# Patient Record
Sex: Female | Born: 1995 | Race: Black or African American | Hispanic: No | Marital: Single | Smoking: Former smoker
Health system: Southern US, Community
[De-identification: ages and names within clinical notes are randomized; demographics above are authoritative.]

## PROBLEM LIST (undated history)

## (undated) DIAGNOSIS — Z789 Other specified health status: Secondary | ICD-10-CM

## (undated) DIAGNOSIS — R002 Palpitations: Secondary | ICD-10-CM

## (undated) DIAGNOSIS — N920 Excessive and frequent menstruation with regular cycle: Secondary | ICD-10-CM

## (undated) DIAGNOSIS — Z8742 Personal history of other diseases of the female genital tract: Secondary | ICD-10-CM

## (undated) HISTORY — PX: NO PAST SURGERIES: SHX2092

---

## 2018-07-14 ENCOUNTER — Encounter (HOSPITAL_COMMUNITY): Payer: Self-pay | Admitting: *Deleted

## 2018-07-14 ENCOUNTER — Emergency Department (HOSPITAL_COMMUNITY)
Admission: EM | Admit: 2018-07-14 | Discharge: 2018-07-14 | Disposition: A | Discharge status: Home / Self Care | Payer: Self-pay | Attending: Emergency Medicine | Admitting: Emergency Medicine

## 2018-07-14 ENCOUNTER — Other Ambulatory Visit: Payer: Self-pay

## 2018-07-14 ENCOUNTER — Emergency Department (HOSPITAL_COMMUNITY): Payer: Self-pay

## 2018-07-14 DIAGNOSIS — R112 Nausea with vomiting, unspecified: Secondary | ICD-10-CM

## 2018-07-14 DIAGNOSIS — F1290 Cannabis use, unspecified, uncomplicated: Secondary | ICD-10-CM | POA: Insufficient documentation

## 2018-07-14 DIAGNOSIS — F17210 Nicotine dependence, cigarettes, uncomplicated: Secondary | ICD-10-CM | POA: Insufficient documentation

## 2018-07-14 DIAGNOSIS — R51 Headache: Secondary | ICD-10-CM | POA: Insufficient documentation

## 2018-07-14 DIAGNOSIS — R569 Unspecified convulsions: Secondary | ICD-10-CM

## 2018-07-14 LAB — CBC WITH DIFFERENTIAL/PLATELET
Abs Immature Granulocytes: 0 10*3/uL (ref 0.00–0.07)
Basophils Absolute: 0.1 10*3/uL (ref 0.0–0.1)
Basophils Relative: 1 %
Eosinophils Absolute: 0 10*3/uL (ref 0.0–0.5)
Eosinophils Relative: 0 %
HCT: 40.5 % (ref 36.0–46.0)
Hemoglobin: 13.3 g/dL (ref 12.0–15.0)
Lymphocytes Relative: 11 %
Lymphs Abs: 0.9 10*3/uL (ref 0.7–4.0)
MCH: 29.4 pg (ref 26.0–34.0)
MCHC: 32.8 g/dL (ref 30.0–36.0)
MCV: 89.4 fL (ref 80.0–100.0)
Monocytes Absolute: 0.1 10*3/uL (ref 0.1–1.0)
Monocytes Relative: 1 %
Neutro Abs: 7 10*3/uL (ref 1.7–7.7)
Neutrophils Relative %: 87 %
Platelets: 202 10*3/uL (ref 150–400)
RBC: 4.53 MIL/uL (ref 3.87–5.11)
RDW: 12 % (ref 11.5–15.5)
WBC: 8 10*3/uL (ref 4.0–10.5)
nRBC: 0 % (ref 0.0–0.2)
nRBC: 0 /100 WBC

## 2018-07-14 LAB — URINALYSIS, ROUTINE W REFLEX MICROSCOPIC
Bacteria, UA: NONE SEEN
Bilirubin Urine: NEGATIVE
Glucose, UA: NEGATIVE mg/dL
Hgb urine dipstick: NEGATIVE
Ketones, ur: 80 mg/dL — AB
Nitrite: NEGATIVE
Protein, ur: NEGATIVE mg/dL
Specific Gravity, Urine: 1.03 (ref 1.005–1.030)
pH: 5 (ref 5.0–8.0)

## 2018-07-14 LAB — PHOSPHORUS: Phosphorus: 2.7 mg/dL (ref 2.5–4.6)

## 2018-07-14 LAB — RAPID URINE DRUG SCREEN, HOSP PERFORMED
Amphetamines: NOT DETECTED
Barbiturates: NOT DETECTED
Benzodiazepines: NOT DETECTED
Cocaine: NOT DETECTED
Opiates: NOT DETECTED
Tetrahydrocannabinol: POSITIVE — AB

## 2018-07-14 LAB — I-STAT BETA HCG BLOOD, ED (MC, WL, AP ONLY): I-stat hCG, quantitative: <5 m[IU]/mL (ref <5)

## 2018-07-14 LAB — COMPREHENSIVE METABOLIC PANEL
ALT: 19 U/L (ref 0–44)
AST: 27 U/L (ref 15–41)
Albumin: 4.7 g/dL (ref 3.5–5.0)
Alkaline Phosphatase: 39 U/L (ref 38–126)
Anion gap: 11 (ref 5–15)
BUN: 18 mg/dL (ref 6–20)
CO2: 21 mmol/L — ABNORMAL LOW (ref 22–32)
Calcium: 9.4 mg/dL (ref 8.9–10.3)
Chloride: 104 mmol/L (ref 98–111)
Creatinine, Ser: 0.89 mg/dL (ref 0.44–1.00)
GFR calc Af Amer: >60 mL/min (ref >60)
GFR calc non Af Amer: >60 mL/min (ref >60)
Glucose, Bld: 89 mg/dL (ref 70–99)
Potassium: 3.8 mmol/L (ref 3.5–5.1)
Sodium: 136 mmol/L (ref 135–145)
Total Bilirubin: 0.9 mg/dL (ref 0.3–1.2)
Total Protein: 8.4 g/dL — ABNORMAL HIGH (ref 6.5–8.1)

## 2018-07-14 LAB — MAGNESIUM: Magnesium: 1.7 mg/dL (ref 1.7–2.4)

## 2018-07-14 LAB — LIPASE, BLOOD: Lipase: 26 U/L (ref 11–51)

## 2018-07-14 MED ORDER — ONDANSETRON 4 MG PO TBDP: 4.0000 mg | ORAL_TABLET | Freq: Three times a day (TID) | ORAL | 0 refills | Status: DC | PRN

## 2018-07-14 MED ORDER — SODIUM CHLORIDE 0.9 % IV BOLUS
1000.0000 mL | Freq: Once | INTRAVENOUS | Status: AC
  Administered 2018-07-14: 15:00:00 1000 mL via INTRAVENOUS

## 2018-07-14 MED ORDER — ONDANSETRON HCL 4 MG/2ML IJ SOLN
4.0000 mg | Freq: Once | INTRAMUSCULAR | Status: AC
  Administered 2018-07-14: 4 mg via INTRAVENOUS
  Filled 2018-07-14: qty 2

## 2018-07-14 MED ORDER — PROMETHAZINE HCL 25 MG/ML IJ SOLN
12.5000 mg | Freq: Once | INTRAMUSCULAR | Status: AC
  Administered 2018-07-14: 15:00:00 12.5 mg via INTRAVENOUS
  Filled 2018-07-14: qty 1

## 2018-07-14 NOTE — ED Triage Notes (Signed)
Pt in reports vomiting onset this am with 12 vomiting episodes in the last 24 hours, denies diarrhea, reports states, "I have always had seizures but I have not been tested for it. My girlfriend said I had a 20 second seizure this morning." pt A&O x4

## 2018-07-14 NOTE — ED Provider Notes (Signed)
[facility] EMERGENCY DEPARTMENT Provider Note   CSN: [id number] Arrival date & time: 07/14/18  1147    History   Chief Complaint Chief Complaint  Patient presents with   Emesis    HPI Hannah Ponce is a 23 y.o. female for evaluation of acute onset, progressively worsening nausea and vomiting beginning around 7 AM this morning upon awakening.  She reports that she awoke feeling unwell and immediately began vomiting.  Reports she has had upwards of 12-15 episodes of nonbloody nonbilious emesis.  She states at some point during this she had 20 seconds of seizure-like activity witnessed by her girlfriend.  She states that this involved feeling a range taste in her mouth subsequently followed by 20 seconds of generalized convulsing, eyes rolling to the back of her head, and "not breathing ".  She reports that this is typical of her usual seizures and states that ever since she had her hair dyed 1 year ago she has had somewhere between 10-20 similar episodes.  She states that these episodes typically occur when she is stressed or after an argument.  They are sometimes associated with urinary incontinence and a postictal period.  She reports that she has not been evaluated for seizure-like activity due to time constraints caring for her son.  The patient denies any abdominal pain, shortness of breath, chest pain, diarrhea, constipation, melena, hematochezia, or urinary symptoms.  She has not tried anything for her symptoms.  She denies recent travel or suspicious food intake.  She works at a hospital.  She reports that she drinks alcohol a few times a week, usually 1 to 2 glasses of wine and occasionally smokes marijuana when she experiences a headache.  She reports that she experiences a "migraine type "headache several times a month and developed a similar headache last night and woke up with a similar headache this morning.  She denies any headache at this time.  Headache was  right-sided.  Denies vision changes, numbness, weakness.     The history is provided by the patient.    History reviewed. No pertinent past medical history.  There are no active problems to display for this patient.   History reviewed. No pertinent surgical history.   OB History   No obstetric history on file.      Home Medications    Prior to Admission medications   Medication Sig Start Date End Date Taking? Authorizing Provider  ondansetron (ZOFRAN ODT) 4 MG disintegrating tablet Take 1 tablet (4 mg total) by mouth every 8 (eight) hours as needed for nausea or vomiting. 07/14/18   Jeanie Sewer, PA-C    Family History No family history on file.  Social History Social History   Tobacco Use   Smoking status: Current Every Day Smoker    Packs/day: 0.10    Types: Cigarettes   Smokeless tobacco: Never Used  Substance Use Topics   Alcohol use: Not Currently   Drug use: Yes    Types: Marijuana     Allergies   Patient has no allergy information on record.   Review of Systems Review of Systems  Constitutional: Negative for chills and fever.  Respiratory: Negative for shortness of breath.   Cardiovascular: Negative for chest pain.  Gastrointestinal: Positive for nausea and vomiting. Negative for constipation and diarrhea.  Neurological: Positive for seizures and headaches. Negative for weakness and numbness.  All other systems reviewed and are negative.    Physical Exam Updated Vital Signs BP 117/80 (BP  Location: Right Arm)    Pulse 99    Temp 97.8 F (36.6 C) (Oral)    Resp 15    Ht  (1.626 m)    Wt 56.7 kg    LMP 07/07/2018    SpO2 100%    BMI 21.46 kg/m   Physical Exam Vitals signs and nursing note reviewed.  Constitutional:      General: She is not in acute distress.    Appearance: She is well-developed.  HENT:     Head: Normocephalic and atraumatic.  Eyes:     General:        Right eye: No discharge.        Left eye: No discharge.      Extraocular Movements: Extraocular movements intact.     Conjunctiva/sclera: Conjunctivae normal.     Pupils: Pupils are equal, round, and reactive to light.  Neck:     Musculoskeletal: Normal range of motion and neck supple.     Vascular: No JVD.     Trachea: No tracheal deviation.  Cardiovascular:     Rate and Rhythm: Regular rhythm. Tachycardia present.     Pulses: Normal pulses.     Heart sounds: Normal heart sounds.  Pulmonary:     Effort: Pulmonary effort is normal.     Breath sounds: Normal breath sounds.  Abdominal:     General: There is no distension.     Palpations: Abdomen is soft.     Tenderness: There is no abdominal tenderness. There is no right CVA tenderness, left CVA tenderness, guarding or rebound.  Skin:    General: Skin is warm and dry.     Findings: No erythema.  Neurological:     Mental Status: She is alert.     Comments: Mental Status:  Alert, thought content appropriate, able to give a coherent history. Speech fluent without evidence of aphasia. Able to follow 2 step commands without difficulty.  Cranial Nerves:  II:  Peripheral visual fields grossly normal, pupils equal, round, reactive to light III,IV, VI: ptosis not present, extra-ocular motions intact bilaterally  V,VII: smile symmetric, facial light touch sensation equal VIII: hearing grossly normal to voice  X: uvula elevates symmetrically  XI: bilateral shoulder shrug symmetric and strong XII: midline tongue extension without fassiculations Motor:  Normal tone. 5/5 strength of BUE and BLE major muscle groups including strong and equal grip strength and dorsiflexion/plantar flexion, no pronator drift Sensory: light touch normal in all extremities. Gait: normal gait and balance.   Psychiatric:     Comments: Somewhat anxious in appearance      ED Treatments / Results  Labs (all labs ordered are listed, but only abnormal results are displayed) Labs Reviewed  COMPREHENSIVE METABOLIC PANEL -  Abnormal; Notable for the following components:      Result Value   CO2 21 (*)    Total Protein 8.4 (*)    All other components within normal limits  URINALYSIS, ROUTINE W REFLEX MICROSCOPIC - Abnormal; Notable for the following components:   APPearance HAZY (*)    Ketones, ur 80 (*)    Leukocytes,Ua MODERATE (*)    All other components within normal limits  RAPID URINE DRUG SCREEN, HOSP PERFORMED - Abnormal; Notable for the following components:   Tetrahydrocannabinol POSITIVE (*)    All other components within normal limits  CBC WITH DIFFERENTIAL/PLATELET  LIPASE, BLOOD  MAGNESIUM  PHOSPHORUS  I-STAT BETA HCG BLOOD, ED ([facility], [facility], [facility] ONLY)    EKG EKG Interpretation  Date/Time:  Tuesday Jul 14 2018 12:50:09 EDT Ventricular Rate:  102 PR Interval:    QRS Duration: 80 QT Interval:  329 QTC Calculation: 429 R Axis:   80 Text Interpretation:  Sinus tachycardia LAE, consider biatrial enlargement Baseline wander in lead(s) V5 Abnormal ekg Confirmed by Gerhard Munch [phone]) on 07/14/2018 12:54:32 PM   Radiology Ct Head Wo Contrast  Result Date: 07/14/2018 CLINICAL DATA:  Vomiting starting today, reported new seizure. EXAM: CT HEAD WITHOUT CONTRAST TECHNIQUE: Contiguous axial images were obtained from the base of the skull through the vertex without intravenous contrast. COMPARISON:  None. FINDINGS: Brain: The brainstem, cerebellum, cerebral peduncles, thalami, basal ganglia, basilar cisterns, and ventricular system appear within normal limits. No intracranial hemorrhage, mass lesion, or acute CVA. Vascular: Unremarkable Skull: Unremarkable Sinuses/Orbits: Minimal chronic ethmoid and sphenoid sinusitis. Other: No supplemental non-categorized findings. IMPRESSION: 1. Minimal chronic ethmoid and sphenoid sinusitis. No acute intracranial findings. Electronically Signed   By: Gaylyn Rong M.D.   On: 07/14/2018 14:36    Procedures Procedures (including critical care  time)  Medications Ordered in ED Medications  ondansetron (ZOFRAN) injection 4 mg (4 mg Intravenous Given 07/14/18 1426)  sodium chloride 0.9 % bolus 1,000 mL (0 mLs Intravenous Stopped 07/14/18 1548)  promethazine (PHENERGAN) injection 12.5 mg (12.5 mg Intravenous Given 07/14/18 1509)     Initial Impression / Assessment and Plan / ED Course  I have reviewed the triage vital signs and the nursing notes.  Pertinent labs & imaging results that were available during my care of the patient were reviewed by me and considered in my medical decision making (see chart for details).        Patient presents for evaluation of nausea and vomiting as well as intermittent headaches and seizure-like activity.  She is afebrile, intermittently tachycardic in the ED with resolution on reevaluation.  She is nontoxic in appearance.  Abdomen is soft and nontender.  She has no complaint of abdominal pain.  With regards to her complaint of seizure-like activity she tells me that she has had episodes in which she will convulse for around 20 seconds at a time since she dyed her hair 1 year ago.  She has not wanted to seek evaluation for these episodes but is requesting head CT today.  She has a normal neurologic examination with no focal neurologic deficits.  Head CT shows minimal chronic ethmoid and sphenoid sinusitis with no acute intracranial abnormalities.  No evidence of mass, hemorrhage, CVA, ICH, SAH.  She has no headaches while in the ED.  UDS positive for THC.  We discussed the importance of follow-up with a neurologist for reevaluation of her headaches and her convulsive episodes.  We also discussed seizure precautions until follow-up with a neurologist.  Discussed with Dr. Jeraldine Loots and feel that she does not require antiepileptic medications at this time without further work-up.  With regards to her nausea and vomiting, I reviewed her lab work independently which shows no leukocytosis, no anemia, no  significant metabolic derangements, no renal insufficiency.  Lipase, LFTs, creatinine within normal limits.  She is not pregnant.  Given benign abdominal examination, doubt acute surgical abdominal pathology.  She was given IV fluids and antiemetics in the ED and on reevaluation she is resting comfortably reports that she has been able to tolerate p.o.  Serial abdominal examinations remain benign.  Discussed advancing diet slowly, pushing fluids.  Recommend follow-up with PCP for reevaluation of her nausea and vomiting.  Discussed strict ED return precautions. Pt  verbalized understanding of and agreement with plan and is safe for discharge home at this time.   Final Clinical Impressions(s) / ED Diagnoses   Final diagnoses:  Non-intractable vomiting with nausea, unspecified vomiting type  Seizure-like activity [facility](HCC)    ED Discharge Orders         Ordered    ondansetron (ZOFRAN ODT) 4 MG disintegrating tablet  Every 8 hours PRN     07/14/18 1548           Jeanie SewerFawze, Airam Runions A, PA-C 07/14/18 1617    Gerhard MunchLockwood, Robert, MD 07/17/18 1942

## 2018-07-14 NOTE — ED Notes (Signed)
Patient verbalizes understanding of discharge instructions . Opportunity for questions and answers were provided . Armband removed by staff ,Pt discharged from ED. W/C  offered at D/C  and Declined W/C at D/C and was escorted to lobby by RN.  

## 2018-07-14 NOTE — Discharge Instructions (Signed)
1. Medications: Take Zofran as needed for nausea.  Let this medicine dissolve under your tongue wait around 10-20 minutes before eating or drinking after taking this medication. 2. Treatment: rest, drink plenty of fluids, advance diet slowly.  Start with water and broth (liquid diet) for 1-2 days then then advance to bland foods that will not upset your stomach such as crackers,toast, pasta, peanut butter, etc.  3. Follow Up: Please followup with your primary doctor in 3 days for discussion of your diagnoses and further evaluation after today's visit; follow-up with a neurologist for your headaches and seizure-like activity for further evaluation.  Do not do any activities that would be dangerous to do if you were to have another seizure-like drive, rock climb, swim, or handle infants; Please return to the ER for persistent vomiting, high fevers, severe headaches, or worsening symptoms

## 2018-07-14 NOTE — ED Notes (Signed)
Nurse and PA at bedside.

## 2019-03-05 DIAGNOSIS — Z8616 Personal history of COVID-19: Secondary | ICD-10-CM

## 2019-03-05 HISTORY — DX: Personal history of COVID-19: Z86.16

## 2019-03-10 ENCOUNTER — Other Ambulatory Visit: Payer: Self-pay

## 2019-03-10 ENCOUNTER — Emergency Department (HOSPITAL_COMMUNITY)
Admission: EM | Admit: 2019-03-10 | Discharge: 2019-03-10 | Disposition: A | Discharge status: Home / Self Care | Payer: Medicaid Other | Attending: Emergency Medicine | Admitting: Emergency Medicine

## 2019-03-10 DIAGNOSIS — F17210 Nicotine dependence, cigarettes, uncomplicated: Secondary | ICD-10-CM | POA: Insufficient documentation

## 2019-03-10 DIAGNOSIS — Z20822 Contact with and (suspected) exposure to covid-19: Secondary | ICD-10-CM | POA: Insufficient documentation

## 2019-03-10 DIAGNOSIS — Z3A01 Less than 8 weeks gestation of pregnancy: Secondary | ICD-10-CM | POA: Insufficient documentation

## 2019-03-10 DIAGNOSIS — O99331 Smoking (tobacco) complicating pregnancy, first trimester: Secondary | ICD-10-CM | POA: Insufficient documentation

## 2019-03-10 DIAGNOSIS — O210 Mild hyperemesis gravidarum: Secondary | ICD-10-CM

## 2019-03-10 LAB — COMPREHENSIVE METABOLIC PANEL
ALT: 24 U/L (ref 0–44)
AST: 23 U/L (ref 15–41)
Albumin: 4 g/dL (ref 3.5–5.0)
Alkaline Phosphatase: 34 U/L — ABNORMAL LOW (ref 38–126)
Anion gap: 12 (ref 5–15)
BUN: 12 mg/dL (ref 6–20)
CO2: 21 mmol/L — ABNORMAL LOW (ref 22–32)
Calcium: 9.1 mg/dL (ref 8.9–10.3)
Chloride: 102 mmol/L (ref 98–111)
Creatinine, Ser: 0.68 mg/dL (ref 0.44–1.00)
GFR calc Af Amer: >60 mL/min (ref >60)
GFR calc non Af Amer: >60 mL/min (ref >60)
Glucose, Bld: 82 mg/dL (ref 70–99)
Potassium: 3.4 mmol/L — ABNORMAL LOW (ref 3.5–5.1)
Sodium: 135 mmol/L (ref 135–145)
Total Bilirubin: 0.8 mg/dL (ref 0.3–1.2)
Total Protein: 7.5 g/dL (ref 6.5–8.1)

## 2019-03-10 LAB — URINALYSIS, ROUTINE W REFLEX MICROSCOPIC
Bilirubin Urine: NEGATIVE
Glucose, UA: NEGATIVE mg/dL
Hgb urine dipstick: NEGATIVE
Ketones, ur: 80 mg/dL — AB
Leukocytes,Ua: NEGATIVE
Nitrite: NEGATIVE
Protein, ur: NEGATIVE mg/dL
Specific Gravity, Urine: 1.019 (ref 1.005–1.030)
pH: 6 (ref 5.0–8.0)

## 2019-03-10 LAB — CBC
HCT: 38.4 % (ref 36.0–46.0)
Hemoglobin: 12.9 g/dL (ref 12.0–15.0)
MCH: 30.1 pg (ref 26.0–34.0)
MCHC: 33.6 g/dL (ref 30.0–36.0)
MCV: 89.7 fL (ref 80.0–100.0)
Platelets: 213 10*3/uL (ref 150–400)
RBC: 4.28 MIL/uL (ref 3.87–5.11)
RDW: 12.1 % (ref 11.5–15.5)
WBC: 8.2 10*3/uL (ref 4.0–10.5)
nRBC: 0 % (ref 0.0–0.2)

## 2019-03-10 LAB — I-STAT BETA HCG BLOOD, ED (MC, WL, AP ONLY): I-stat hCG, quantitative: >2000 m[IU]/mL — ABNORMAL HIGH (ref <5)

## 2019-03-10 LAB — LIPASE, BLOOD: Lipase: 23 U/L (ref 11–51)

## 2019-03-10 LAB — SARS CORONAVIRUS 2 (TAT 6-24 HRS): SARS Coronavirus 2: NEGATIVE

## 2019-03-10 MED ORDER — ONDANSETRON 4 MG PO TBDP
4.0000 mg | ORAL_TABLET | Freq: Once | ORAL | Status: AC | PRN
  Administered 2019-03-10: 16:00:00 4 mg via ORAL
  Filled 2019-03-10: qty 1

## 2019-03-10 MED ORDER — SODIUM CHLORIDE 0.9% FLUSH: 3.0000 mL | Freq: Once | INTRAVENOUS | Status: DC

## 2019-03-10 MED ORDER — ONDANSETRON 4 MG PO TBDP: 4.0000 mg | ORAL_TABLET | Freq: Three times a day (TID) | ORAL | 0 refills | Status: DC | PRN

## 2019-03-10 MED ORDER — ONDANSETRON HCL 4 MG/2ML IJ SOLN
4.0000 mg | Freq: Once | INTRAMUSCULAR | Status: AC
  Administered 2019-03-10: 18:00:00 4 mg via INTRAVENOUS
  Filled 2019-03-10: qty 2

## 2019-03-10 MED ORDER — SODIUM CHLORIDE 0.9 % IV BOLUS
1000.0000 mL | Freq: Once | INTRAVENOUS | Status: AC
  Administered 2019-03-10: 1000 mL via INTRAVENOUS

## 2019-03-10 NOTE — ED Provider Notes (Signed)
[place] EMERGENCY DEPARTMENT Provider Note   CSN: [id number] Arrival date & time: 03/10/19  1424     History No chief complaint on file.   Hannah Ponce is a 24 y.o. female.  24 year old female who presents for 6 days of vomiting.  Patient with a few episodes of diarrhea.  Vomiting is throughout the day, vomit is nonbloody nonbilious.  She is unable to tolerate any fluids or food.  No known sick contacts.  No abdominal pain.  No vaginal pain.  No pelvic pain.  No vaginal discharge.  No dysuria.  No vaginal bleeding.  Patient is sexually active.  No cough or URI symptoms.  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  The history is provided by the patient. No language interpreter was used.  Emesis Severity:  Moderate Duration:  6 days Timing:  Intermittent Number of daily episodes:  10 Quality:  Stomach contents Progression:  Unchanged Chronicity:  New Recent urination:  Normal Context: not post-tussive and not self-induced   Relieved by:  None tried Worsened by:  Food smell Ineffective treatments:  None tried Associated symptoms: diarrhea   Associated symptoms: no abdominal pain, no chills, no cough, no fever, no headaches, no myalgias, no sore throat and no URI   Diarrhea:    Quality:  Watery   Number of occurrences:  2   Severity:  Mild   Timing:  Intermittent   Progression:  Unchanged Risk factors: no prior abdominal surgery, no sick contacts, no suspect food intake and no travel to endemic areas        No past medical history on file.  There are no problems to display for this patient.   No past surgical history on file.   OB History   No obstetric history on file.     No family history on file.  Social History   Tobacco Use  . Smoking status: Current Every Day Smoker    Packs/day: 0.10    Types: Cigarettes  . Smokeless tobacco: Never Used  Substance Use Topics  . Alcohol use: Not Currently  . Drug use: Yes    Types: Marijuana    Home Medications Prior to Admission medications   Medication Sig Start Date End Date Taking? Authorizing Provider  ondansetron (ZOFRAN ODT) 4 MG disintegrating tablet Take 1 tablet (4 mg total) by mouth every 8 (eight) hours as needed for nausea or vomiting. 03/10/19   Louanne Skye, MD    Allergies    Patient has no known allergies.  Review of Systems   Review of Systems  Constitutional: Negative for chills and fever.  HENT: Negative for sore throat.   Respiratory: Negative for cough.   Gastrointestinal: Positive for diarrhea and vomiting. Negative for abdominal pain.  Musculoskeletal: Negative for myalgias.  Neurological: Negative for headaches.  All other systems reviewed and are negative.   Physical Exam Updated Vital Signs BP 124/75 (BP Location: Right Arm)   Pulse 92   Temp 98.6 F (37 C) (Oral)   Resp 16   LMP 02/03/2019   SpO2 99%   Physical Exam Vitals and nursing note reviewed.  Constitutional:      Appearance: She is well-developed.  HENT:     Head: Normocephalic and atraumatic.     Right Ear: External ear normal.     Left Ear: External ear normal.  Eyes:     Conjunctiva/sclera: Conjunctivae normal.  Cardiovascular:     Rate and Rhythm: Normal rate.  Heart sounds: Normal heart sounds.  Pulmonary:     Effort: Pulmonary effort is normal.     Breath sounds: Normal breath sounds.  Abdominal:     General: Bowel sounds are normal.     Palpations: Abdomen is soft.     Tenderness: There is no abdominal tenderness. There is no guarding or rebound.  Musculoskeletal:        General: No deformity or signs of injury. Normal range of motion.     Cervical back: Normal range of motion and neck supple.  Skin:    General: Skin is warm.     Capillary Refill: Capillary refill takes less than 2 seconds.  Neurological:     Mental Status: She is alert and oriented to person, place, and time.     ED Results / Procedures / Treatments    Labs (all labs ordered are listed, but only abnormal results are displayed) Labs Reviewed  COMPREHENSIVE METABOLIC PANEL - Abnormal; Notable for the following components:      Result Value   Potassium 3.4 (*)    CO2 21 (*)    Alkaline Phosphatase 34 (*)    All other components within normal limits  URINALYSIS, ROUTINE W REFLEX MICROSCOPIC - Abnormal; Notable for the following components:   Ketones, ur 80 (*)    All other components within normal limits  I-STAT BETA HCG BLOOD, ED ([facility], [facility], [facility] ONLY) - Abnormal; Notable for the following components:   I-stat hCG, quantitative >2,000.0 (*)    All other components within normal limits  SARS CORONAVIRUS 2 (TAT 6-24 HRS)  LIPASE, BLOOD  CBC    EKG None  Radiology No results found.  Procedures Procedures (including critical care time)  Medications Ordered in ED Medications  sodium chloride flush (NS) 0.9 % injection 3 mL (has no administration in time range)  ondansetron (ZOFRAN-ODT) disintegrating tablet 4 mg (4 mg Oral Given 03/10/19 1536)  sodium chloride 0.9 % bolus 1,000 mL (1,000 mLs Intravenous New Bag/Given 03/10/19 1742)  ondansetron (ZOFRAN) injection 4 mg (4 mg Intravenous Given 03/10/19 1742)    ED Course  I have reviewed the triage vital signs and the nursing notes.  Pertinent labs & imaging results that were available during my care of the patient were reviewed by me and considered in my medical decision making (see chart for details).    MDM Rules/Calculators/A&P                      24 year old female who presents for nausea and vomiting over the past 6 days.  Patient with intermittent diarrhea.  Possible gastroenteritis.  Patient is sexually active, possible pregnancy.  Patient denies any dysuria or fevers.  Still possible UTI,  Patient with mild dehydration given heart rate of 92.  Will give IV fluid bolus.  Will check electrolytes and CBC.  Will check lipase to evaluate for any signs of pancreatitis.  Will obtain  UA to look for possible UTI.  Will obtain beta-hCG to evaluate for possible pregnancy.  Will give Zofran to help with nausea and vomiting.  Patient's labs have been reviewed, patient with dehydration which has improved with IV fluids.  Patient feeling better after Zofran for a couple hours.  Patient is noted to be approximately 3 to [redacted] weeks pregnant with a positive beta hCG.  Again patient denies any vaginal discharge, vaginal bleeding, pelvic pain.  Do not believe that ultrasound is necessary at this time.  Pt is feeling better.  Will dc home with zofran and close follow up with her OB.  Discussed signs that warrant reevaluation.   Discussed need for isolation as COVId test is pending.     Final Clinical Impression(s) / ED Diagnoses Final diagnoses:  Hyperemesis gravidarum    Rx / DC Orders ED Discharge Orders         Ordered    ondansetron (ZOFRAN ODT) 4 MG disintegrating tablet  Every 8 hours PRN     03/10/19 1841           Niel Hummer, MD 03/10/19 [phone]

## 2019-03-10 NOTE — ED Triage Notes (Signed)
Onset 6 days vomiting, nausea.  Not able to tolerate any PO fluids/food.

## 2019-11-05 ENCOUNTER — Other Ambulatory Visit: Payer: Self-pay

## 2020-03-04 NOTE — L&D Delivery Note (Signed)
[facility] Delivery Note  Hannah Ponce is a 25 y.o. [id number] s/p SVD at [redacted]w[redacted]d. She was admitted for active labor with SROM.   ROM: 0h 16m with clear fluid GBS Status: Unknown Maximum Maternal Temperature:   Labor Progress: Patient arrived in MAU report onset of contractions at 0400 and SROM while en route to the hospital.  Upon nurse evaluation, she was found to be complete and +1 station with spontaneous pushing.  She delivered as below with staff support in MAU.    Delivery Date/Time: October 29, 2020 at 0616 Delivery: Called to room and patient was complete and pushing. Head delivered in LOA position. No nuchal cord present. Shoulder and body delivered in usual fashion and body cord noted that was easily reduced. Infant with spontaneous cry and tactile stimulation given by provider.  Infant placed on mother's abdomen, where nurses continued to dry and stimulate. Cord clamped x 2 after 1-minute delay, and cut by FOB-Joshua. Cord blood drawn. Placenta delivered spontaneously with gentle cord traction. Trailing membranes noted and removed with ring forceps.  Fundus firm with massage and Pitocin 10 units given in left thigh. Labia, perineum, vagina, and cervix inspected inspected with hemostatic perineal abrasion noted. Mother desires to breastfeed.  Placenta: Spontaneous, Shultz, Intact Complications: None Lacerations: None EBL: [id number] Analgesia: None  Postpartum Planning [X]  message to sent to schedule follow-up  [ ]  vaccines UTD  Infant: Female-Athena Grace  APGARs 9, 9  3141g (6lb 14.8oz), 19.76in  , CNM  10/29/2020 7:13 AM

## 2020-03-14 ENCOUNTER — Other Ambulatory Visit: Payer: Self-pay

## 2020-03-14 ENCOUNTER — Encounter (HOSPITAL_COMMUNITY): Payer: Self-pay | Admitting: Family Medicine

## 2020-03-14 ENCOUNTER — Inpatient Hospital Stay (HOSPITAL_COMMUNITY)
Admission: AD | Admit: 2020-03-14 | Discharge: 2020-03-14 | Disposition: A | Discharge status: Home / Self Care | Payer: Medicaid Other | Attending: Family Medicine | Admitting: Family Medicine

## 2020-03-14 DIAGNOSIS — Z3A01 Less than 8 weeks gestation of pregnancy: Secondary | ICD-10-CM | POA: Diagnosis not present

## 2020-03-14 DIAGNOSIS — O98511 Other viral diseases complicating pregnancy, first trimester: Secondary | ICD-10-CM | POA: Insufficient documentation

## 2020-03-14 DIAGNOSIS — O99331 Smoking (tobacco) complicating pregnancy, first trimester: Secondary | ICD-10-CM | POA: Diagnosis not present

## 2020-03-14 DIAGNOSIS — F17210 Nicotine dependence, cigarettes, uncomplicated: Secondary | ICD-10-CM | POA: Insufficient documentation

## 2020-03-14 DIAGNOSIS — F1290 Cannabis use, unspecified, uncomplicated: Secondary | ICD-10-CM

## 2020-03-14 DIAGNOSIS — U071 COVID-19: Secondary | ICD-10-CM | POA: Insufficient documentation

## 2020-03-14 DIAGNOSIS — O219 Vomiting of pregnancy, unspecified: Secondary | ICD-10-CM

## 2020-03-14 DIAGNOSIS — O210 Mild hyperemesis gravidarum: Secondary | ICD-10-CM

## 2020-03-14 DIAGNOSIS — O99321 Drug use complicating pregnancy, first trimester: Secondary | ICD-10-CM | POA: Diagnosis not present

## 2020-03-14 HISTORY — DX: Other specified health status: Z78.9

## 2020-03-14 LAB — CBC WITH DIFFERENTIAL/PLATELET
Abs Immature Granulocytes: 0.02 10*3/uL (ref 0.00–0.07)
Basophils Absolute: 0 10*3/uL (ref 0.0–0.1)
Basophils Relative: 0 %
Eosinophils Absolute: 0 10*3/uL (ref 0.0–0.5)
Eosinophils Relative: 0 %
HCT: 43 % (ref 36.0–46.0)
Hemoglobin: 14.2 g/dL (ref 12.0–15.0)
Immature Granulocytes: 0 %
Lymphocytes Relative: 20 %
Lymphs Abs: 1.4 10*3/uL (ref 0.7–4.0)
MCH: 29 pg (ref 26.0–34.0)
MCHC: 33 g/dL (ref 30.0–36.0)
MCV: 87.8 fL (ref 80.0–100.0)
Monocytes Absolute: 0.5 10*3/uL (ref 0.1–1.0)
Monocytes Relative: 7 %
Neutro Abs: 4.9 10*3/uL (ref 1.7–7.7)
Neutrophils Relative %: 73 %
Platelets: 216 10*3/uL (ref 150–400)
RBC: 4.9 MIL/uL (ref 3.87–5.11)
RDW: 11.8 % (ref 11.5–15.5)
WBC: 6.8 10*3/uL (ref 4.0–10.5)
nRBC: 0 % (ref 0.0–0.2)

## 2020-03-14 LAB — COMPREHENSIVE METABOLIC PANEL
ALT: 21 U/L (ref 0–44)
AST: 26 U/L (ref 15–41)
Albumin: 4.3 g/dL (ref 3.5–5.0)
Alkaline Phosphatase: 40 U/L (ref 38–126)
Anion gap: 14 (ref 5–15)
BUN: 13 mg/dL (ref 6–20)
CO2: 22 mmol/L (ref 22–32)
Calcium: 9.7 mg/dL (ref 8.9–10.3)
Chloride: 100 mmol/L (ref 98–111)
Creatinine, Ser: 0.64 mg/dL (ref 0.44–1.00)
GFR, Estimated: >60 mL/min (ref >60)
Glucose, Bld: 88 mg/dL (ref 70–99)
Potassium: 3.6 mmol/L (ref 3.5–5.1)
Sodium: 136 mmol/L (ref 135–145)
Total Bilirubin: 0.7 mg/dL (ref 0.3–1.2)
Total Protein: 8.1 g/dL (ref 6.5–8.1)

## 2020-03-14 LAB — URINALYSIS, ROUTINE W REFLEX MICROSCOPIC
Bilirubin Urine: NEGATIVE
Glucose, UA: NEGATIVE mg/dL
Hgb urine dipstick: NEGATIVE
Ketones, ur: 80 mg/dL — AB
Nitrite: NEGATIVE
Protein, ur: 100 mg/dL — AB
Specific Gravity, Urine: 1.033 — ABNORMAL HIGH (ref 1.005–1.030)
pH: 5 (ref 5.0–8.0)

## 2020-03-14 LAB — POCT PREGNANCY, URINE: Preg Test, Ur: POSITIVE — AB

## 2020-03-14 MED ORDER — ONDANSETRON 4 MG PO TBDP: 4.0000 mg | ORAL_TABLET | Freq: Three times a day (TID) | ORAL | 1 refills | Status: AC | PRN

## 2020-03-14 MED ORDER — SODIUM CHLORIDE 0.9 % IV SOLN
8.0000 mg | Freq: Once | INTRAVENOUS | Status: AC
  Administered 2020-03-14: 8 mg via INTRAVENOUS
  Filled 2020-03-14: qty 4

## 2020-03-14 MED ORDER — LACTATED RINGERS IV BOLUS
500.0000 mL | Freq: Once | INTRAVENOUS | Status: AC
  Administered 2020-03-14: 500 mL via INTRAVENOUS

## 2020-03-14 MED ORDER — METOCLOPRAMIDE HCL 10 MG PO TABS: 10.0000 mg | ORAL_TABLET | Freq: Three times a day (TID) | ORAL | 1 refills | Status: DC

## 2020-03-14 MED ORDER — LACTATED RINGERS IV BOLUS
1000.0000 mL | Freq: Once | INTRAVENOUS | Status: AC
  Administered 2020-03-14: 1000 mL via INTRAVENOUS

## 2020-03-14 MED ORDER — SCOPOLAMINE 1 MG/3DAYS TD PT72
1.0000 | MEDICATED_PATCH | TRANSDERMAL | Status: DC
  Administered 2020-03-14: 1.5 mg via TRANSDERMAL
  Filled 2020-03-14: qty 1

## 2020-03-14 MED ORDER — METOCLOPRAMIDE HCL 5 MG/ML IJ SOLN
10.0000 mg | Freq: Once | INTRAMUSCULAR | Status: AC
  Administered 2020-03-14: 10 mg via INTRAVENOUS
  Filled 2020-03-14: qty 2

## 2020-03-14 MED ORDER — PANTOPRAZOLE SODIUM 40 MG IV SOLR
40.0000 mg | Freq: Once | INTRAVENOUS | Status: AC
  Administered 2020-03-14: 40 mg via INTRAVENOUS
  Filled 2020-03-14: qty 40

## 2020-03-14 MED ORDER — SCOPOLAMINE 1 MG/3DAYS TD PT72: 1.0000 | MEDICATED_PATCH | TRANSDERMAL | 12 refills | Status: DC

## 2020-03-14 MED ORDER — FAMOTIDINE 20 MG PO TABS: 20.0000 mg | ORAL_TABLET | Freq: Every day | ORAL | 1 refills | Status: DC

## 2020-03-14 NOTE — MAU Note (Signed)
Presents with c/o N/V x4 days, reports unable to keep anything down.  Reports immediately vomits if she attempts to eat/drink.  LMP 01/30/2020.  Denies VB.

## 2020-03-14 NOTE — Discharge Instructions (Signed)
Safe Medications in Pregnancy    Acne: Benzoyl Peroxide Salicylic Acid  Backache/Headache: Tylenol: 2 regular strength every 4 hours OR              2 Extra strength every 6 hours  Colds/Coughs/Allergies: Benadryl (alcohol free) 25 mg every 6 hours as needed Breath right strips Claritin Cepacol throat lozenges Chloraseptic throat spray Cold-Eeze- up to three times per day Cough drops, alcohol free Flonase (by prescription only) Guaifenesin Mucinex Robitussin DM (plain only, alcohol free) Saline nasal spray/drops Sudafed (pseudoephedrine) & Actifed ** use only after [redacted] weeks gestation and if you do not have high blood pressure Tylenol Vicks Vaporub Zinc lozenges Zyrtec   Constipation: Colace Ducolax suppositories Fleet enema Glycerin suppositories Metamucil Milk of magnesia Miralax Senokot Smooth move tea  Diarrhea: Kaopectate Imodium A-D  *NO pepto Bismol  Hemorrhoids: Anusol Anusol HC Preparation H Tucks  Indigestion: Tums Maalox Mylanta Zantac  Pepcid  Insomnia: Benadryl (alcohol free) 25mg  every 6 hours as needed Tylenol PM Unisom, no Gelcaps  Leg Cramps: Tums MagGel  Nausea/Vomiting:  Bonine Dramamine Emetrol Ginger extract Sea bands Meclizine  Nausea medication to take during pregnancy:  Unisom (doxylamine succinate 25 mg tablets) Take one tablet daily at bedtime. If symptoms are not adequately controlled, the dose can be increased to a maximum recommended dose of two tablets daily (1/2 tablet in the morning, 1/2 tablet mid-afternoon and one at bedtime). Vitamin B6 100mg  tablets. Take one tablet twice a day (up to 200 mg per day).  Skin Rashes: Aveeno products Benadryl cream or 25mg  every 6 hours as needed Calamine Lotion 1% cortisone cream  Yeast infection: Gyne-lotrimin 7 Monistat 7   **If taking multiple medications, please check labels to avoid duplicating the same active ingredients **take  medication as directed on the label ** Do not exceed 4000 mg of tylenol in 24 hours **Do not take medications that contain aspirin or ibuprofen          Prenatal Care Providers           [facility] @ [facility] - accepts patients without insurance  Phone: [phone]  Center for @ [facility]   Phone: [phone]  [facility] @[place]       Phone: [phone]            [facility] @ [place]     Phone: [phone]          [facility] for [id number] @ [facility]   Phone: [phone]  [facility] @ [facility] - accepts patients without insurance  Phone: [phone]  [facility] @ [facility] Phone: [phone]     [facility] Department - accepts patients without insurance Phone: [phone]  [facility]  Phone: [phone]  [facility] Phone: [phone]  [facility] Phone: [phone]  [facility] Phone: [phone]  [facility] Phone: [phone]  [facility]  Phone: [phone]         Morning Sickness  Morning sickness is when a woman feels nauseous during pregnancy. This nauseous feeling may or may not come with vomiting. It often occurs in the morning, but it can be a problem at any time of day. Morning sickness is most common during the first trimester. In some cases, it may continue throughout pregnancy. Although morning sickness is unpleasant, it is usually harmless unless the woman develops severe and continual vomiting (hyperemesis gravidarum), a  condition that requires more intense treatment. What are the causes? The exact cause of this condition is not known, but it seems to be related to normal hormonal changes that occur in pregnancy. What increases the risk? You are more likely to develop this condition if:  You experienced  nausea or vomiting before your pregnancy.  You had morning sickness during a previous pregnancy.  You are pregnant with more than one baby, such as twins. What are the signs or symptoms? Symptoms of this condition include:  Nausea.  Vomiting. How is this diagnosed? This condition is usually diagnosed based on your signs and symptoms. How is this treated? In many cases, treatment is not needed for this condition. Making some changes to what you eat may help to control symptoms. Your health care provider may also prescribe or recommend:  Vitamin B6 supplements.  Anti-nausea medicines.  Ginger. Follow these instructions at home: Medicines  Take over-the-counter and prescription medicines only as told by your health care provider. Do not use any prescription, over-the-counter, or herbal medicines for morning sickness without first talking with your health care provider.  Take multivitamins before getting pregnant. This can prevent or decrease the severity of morning sickness in most women. Eating and drinking  Eat a piece of dry toast or crackers before getting out of bed in the morning.  Eat 5 or 6 small meals a day.  Eat dry and bland foods, such as rice or a baked potato. Foods that are high in carbohydrates are often helpful.  Avoid greasy, fatty, and spicy foods.  Have someone cook for you if the smell of any food causes nausea and vomiting.  If you feel nauseous after taking prenatal vitamins, take the vitamins at night or with a snack.  Eat a protein snack between meals if you are hungry. Nuts, yogurt, and cheese are good options.  Drink fluids throughout the day.  Try ginger ale made with real ginger, ginger tea made from fresh grated ginger, or ginger candies. General instructions  Do not use any products that contain nicotine or tobacco. These products include cigarettes, chewing tobacco, and vaping devices, such as e-cigarettes. If you need help quitting, ask  your health care provider.  Get an air purifier to keep the air in your house free of odors.  Get plenty of fresh air.  Try to avoid odors that trigger your nausea.  Consider trying these methods to help relieve symptoms: ? Wearing an acupressure wristband. These wristbands are often worn for seasickness. ? Acupuncture. Contact a health care provider if:  Your home remedies are not working and you need medicine.  You feel dizzy or light-headed.  You are losing weight. Get help right away if:  You have persistent and uncontrolled nausea and vomiting.  You faint.  You have severe pain in your abdomen. Summary  Morning sickness is when a woman feels nauseous during pregnancy. This nauseous feeling may or may not come with vomiting.  Morning sickness is most common during the first trimester.  It often occurs in the morning, but it can be a problem at any time of day.  In many cases, treatment is not needed for this condition. Making some changes to what you eat may help to control symptoms. This information is not intended to replace advice given to you by your health care provider. Make sure you discuss any questions you have with your health care provider. Document Revised: 10/04/2019 Document Reviewed: 09/13/2019 Elsevier Patient Education  2021 Elsevier  Inc.        Hyperemesis Gravidarum Hyperemesis gravidarum is a severe form of nausea and vomiting that happens during pregnancy. Hyperemesis is worse than morning sickness. It may cause you to have nausea or vomiting all day for many days. It may keep you from eating and drinking enough food and liquids, which can lead to dehydration, malnutrition, and weight loss. Hyperemesis usually occurs during the first half (the first 20 weeks) of pregnancy. It often goes away once a woman is in her second half of pregnancy. However, sometimes hyperemesis continues through an entire pregnancy. What are the causes? The cause of  this condition is not known. It may be associated with:  Changes in hormones in the body during pregnancy.  Changes in the gastrointestinal system.  Genetic or inherited conditions. What are the signs or symptoms? Symptoms of this condition include:  Severe nausea and vomiting that does not go away.  Problems keeping food down.  Weight loss.  Loss of body fluid (dehydration).  Loss of appetite. You may have no desire to eat or you may not like the food you have previously enjoyed. How is this diagnosed? This condition may be diagnosed based on your medical history, your symptoms, and a physical exam. You may also have other tests, including:  Blood tests.  Urine tests.  Blood pressure tests.  Ultrasound to look for problems with the placenta or to check if you are pregnant with more than one baby. How is this treated? This condition is managed by controlling symptoms. This may include:  Following an eating plan. This can help to lessen nausea and vomiting.  Treatments that do not use medicine. These include acupressure bracelets, hypnosis, and eating or drinking foods or fluids that contain ginger, ginger ale, or ginger tea.  Taking prescription medicine or over-the-counter medicine as told by your health care provider.  Continuing to take prenatal vitamins. You may need to change what kind you take and when you take them. Follow your health care provider's instructions about prenatal vitamins. An eating plan and medicines are often used together to help control symptoms. If medicines do not help relieve nausea and vomiting, you may need to receive fluids through an IV at the hospital. Follow these instructions at home: To help relieve your symptoms, listen to your body. Everyone is different and has different preferences. Find what works best for you. Here are some things you can try to help relieve your symptoms: Meals and snacks  Eat 5-6 small meals daily instead of 3  large meals. Eating small meals and snacks can help you avoid an empty stomach.  Before getting out of bed, eat a couple of crackers to avoid moving around on an empty stomach.  Eat a protein-rich snack before bed. Examples include cheese and crackers, or a peanut butter sandwich made with 1 slice of whole-wheat bread and 1 tsp (5 g) of peanut butter.  Eat and drink slowly.  Try eating starchy foods as these are usually tolerated well. Examples include cereal, toast, bread, potatoes, pasta, rice, and pretzels.  Eat at least one serving of protein with your meals and snacks. Protein options include lean meats, poultry, seafood, beans, nuts, nut butters, eggs, cheese, and yogurt.  Eat or suck on things that have ginger in them. It may help to relieve nausea. Add  tsp (0.44 g) ground ginger to hot tea, or choose ginger tea.   Fluids It is important to stay hydrated. Try to:  Drink small  amounts of fluids often.  Drink fluids 30 minutes before or after a meal to help lessen the feeling of a full stomach.  Drink 100% fruit juice or an electrolyte drink. An electrolyte drink contains sodium, potassium, and chloride.  Drink fluids that are cold, clear, and carbonated or sour. These include lemonade, ginger ale, lemon-lime soda, ice water, and sparkling water. Things to avoid Avoid the following:  Eating foods that trigger your symptoms. These may include spicy foods, coffee, high-fat foods, very sweet foods, and acidic foods.  Drinking more than 1 cup of fluid at a time.  Skipping meals. Nausea can be more intense on an empty stomach. If you cannot tolerate food, do not force it. Try sucking on ice chips or other frozen items and make up for missed calories later.  Lying down within 2 hours after eating.  Being exposed to environmental triggers. These may include food smells, smoky rooms, closed spaces, rooms with strong smells, warm or humid places, overly loud and noisy rooms, and rooms  with motion or flickering lights. Try eating meals in a well-ventilated area that is free of strong smells.  Making quick and sudden changes in your movement.  Taking iron pills and multivitamins that contain iron. If you take prescription iron pills, do not stop taking them unless your health care provider approves.  Preparing food. The smell of food can spoil your appetite or trigger nausea. General instructions  Brush your teeth or use a mouth rinse after meals.  Take over-the-counter and prescription medicines only as told by your health care provider.  Follow instructions from your health care provider about eating or drinking restrictions.  Talk with your health care provider about starting a supplement of vitamin B6.  Continue to take your prenatal vitamins as told by your health care provider. If you are having trouble taking your prenatal vitamins, talk with your health care provider about other options.  Keep all follow-up visits. This is important. Follow-up visits include prenatal visits. Contact a health care provider if:  You have pain in your abdomen.  You have a severe headache.  You have vision problems.  You are losing weight.  You feel weak or dizzy.  You cannot eat or drink without vomiting, especially if this goes on for a full day. Get help right away if:  You cannot drink fluids without vomiting.  You vomit blood.  You have constant nausea and vomiting.  You are very weak.  You faint.  You have a fever and your symptoms suddenly get worse. Summary  Hyperemesis gravidarum is a severe form of nausea and vomiting that happens during pregnancy.  Making some changes to your eating habits may help relieve nausea and vomiting.  This condition may be managed with lifestyle changes and medicines as prescribed by your health care provider.  If medicines do not help relieve nausea and vomiting, you may need to receive fluids through an IV at the  hospital. This information is not intended to replace advice given to you by your health care provider. Make sure you discuss any questions you have with your health care provider. Document Revised: 09/13/2019 Document Reviewed: 09/13/2019 Elsevier Patient Education  2021 Elsevier Inc.        Cannabinoid Hyperemesis Syndrome Cannabinoid hyperemesis syndrome (CHS) is a condition that causes repeated nausea, vomiting, and abdominal pain after long-term (chronic) use of marijuana (cannabis). People with CHS typically use marijuana 3-5 times a day for many years before they have symptoms, although  it is possible to develop CHS with far less daily use. Symptoms of CHS may be mild at first but can get worse and more frequent. In some cases, CHS may cause severe daily vomiting, which can lead to weight loss and dehydration. What are the causes? The exact cause of this condition is not known. Long-term use of marijuana may over-stimulate certain proteins in the brain and gastrointestinal tract that react with chemicals in marijuana (cannabinoid receptors). This over-stimulation may cause CHS. What are the signs or symptoms? Symptoms of this condition are often mild during the first few episodes, but they can get worse over time. Symptoms may include:  Frequent nausea, especially early in the morning.  Vomiting. This can become severe.  Abdominal pain.  Feeling very tired (lethargic).  Headaches. CHS may go away and come back many times (recur). People may not have symptoms or may otherwise be healthy in [facility]. Taking hot showers can relieve the symptoms of CHS, so feeling the need to take several hot showers throughout the day can be a sign of this condition. How is this diagnosed? This condition may be diagnosed based on:  Your symptoms and medical history, including any drug use.  A physical exam. You may have tests done to rule out other problems that could cause your  symptoms. These tests may include:  Blood tests.  Urine tests.  Imaging tests, such as an X-ray or a CT scan. How is this treated? Treatment for this condition involves stopping marijuana use. Treatment may include:  A drug rehabilitation program, if you have trouble stopping marijuana use.  Medicines for nausea. These may be given at the hospital through an IV inserted into one of your veins, or they may be medicines that you take by mouth (orally).  Certain creams that contain a substance called capsaicin. These may improve symptoms when applied to the abdomen.  Hot showers to help relieve symptoms. In severe cases, you may need treatment at a hospital. You may be given IV fluids to prevent or treat dehydration as well as medicines to treat nausea, vomiting, and pain. Follow these instructions at home: During an episode of CHS  Stay in bed and rest in a dark, quiet room.  Take anti-nausea medicine as told by your health care provider.  Try taking hot showers to relieve your symptoms.   After an episode of CHS  Drink small amounts of clear fluids slowly. Gradually add more if you can keep the fluids down without vomiting.  Once you are able to eat without vomiting, eat soft foods in small amounts every 3-4 hours. General instructions  Do not use any products that contain marijuana.If you need help quitting, ask your health care provider for resources and treatment options.  Drink enough fluid to keep your urine pale yellow. Avoid drinking fluids that have a lot of sugar or caffeine, such as coffee and soda.  Take and apply over-the-counter and prescription medicines only as told by your health care provider. Ask your health care provider before starting any new medicines or treatments.  Keep all follow-up visits as told by your health care provider. This is important. This includes any recommended substance abuse programs.   Contact a health care provider if:  Your symptoms  get worse.  You cannot drink fluids without vomiting or severe pain.  You have pain and trouble swallowing after an episode. Get help right away if you:  Cannot stop vomiting.  Have blood in your vomit  or your vomit looks like coffee grounds.  Have severe abdominal pain.  Have stools that are bloody or black, or stools that look like tar.  Have symptoms of dehydration, such as: ? Sunken eyes. ? Inability to make tears. ? Cracked lips. ? Dry mouth. ? Decreased urine production. ? Weakness. ? Sleepiness. ? Dizziness, light-headedness, or fainting. Summary  Cannabinoid hyperemesis syndrome (CHS) is a condition that causes repeated nausea, vomiting, and abdominal pain after long-term use of marijuana.  People with CHS typically use marijuana 3-5 times a day for many years before they have symptoms, although it is possible to develop CHS with much less daily use.  Treatment for this condition involves stopping marijuana use. Hot showers and capsaicin creams may also help relieve symptoms. Ask your health care provider before starting any medicines or other treatments.  Your health care provider may prescribe medicines to help with nausea.  Get help right away if you have signs of dehydration, such as dry mouth, decreased urine production, weakness, dizziness, and light-headedness. This information is not intended to replace advice given to you by your health care provider. Make sure you discuss any questions you have with your health care provider. Document Revised: 12/16/2018 Document Reviewed: 12/16/2018 Elsevier Patient Education  2021 Elsevier Inc.        Marijuana Use During Pregnancy and Breastfeeding Marijuana is the dried leaves, flowers, and stems of the Cannabis sativa or Cannabis indica plant. The plants have many active ingredients, including a chemical called THC. Some of these chemicals, especially THC, change the way the brain works. Marijuana smoke also has  many of the same chemicals as cigarette smoke that cause breathing problems. Marijuana can be inhaled by smoking or vaping. It can be used on the skin as a cream, lotion, gel, or patch. It can also be taken by mouth in the form of cookies, candy, or drinks. Using marijuana in any form may be harmful for you and your baby when you are trying to become pregnant and during pregnancy. This includes marijuana that is prescribed to you by a health care provider (medical marijuana). Once marijuana is in your blood, it can travel through your placenta to your baby. It may also pass through breast milk. How does using marijuana affect me? It can affect your general health Marijuana affects you both mentally and physically. Using marijuana can make you feel high and relaxed. It can also have negative effects, especially at high doses or with long-term use. These include:  Rapid heartbeat and stress on your heart.  Lung irritation and breathing problems.  Difficulty thinking and making decisions.  Seeing or believing things that are not true (hallucinations and paranoia).  Mood swings, depression, or anxiety.  Decreased ability to learn and remember. It can affect your pregnancy Marijuana can also affect your pregnancy. Not all the effects are known. However, if you use marijuana during pregnancy, you may:  Have difficulty getting pregnant.  Be less likely to get regular prenatal care and do the things that you need to do to have a healthy pregnancy.  Be more likely to use other drugs that can harm your pregnancy, like drinking alcohol and smoking cigarettes.  Be at higher risk of having your baby die after 28 weeks of pregnancy. This is called stillbirth.  Be at higher risk of giving birth before 37 weeks of pregnancy (premature birth). How does using marijuana affect my baby? If you use marijuana during pregnancy, this may affect your baby's development,  birth, and life after birth. Your baby  may:  Be born prematurely, which can cause physical and mental health conditions.  Be born with a low birth weight, which can lead to physical and mental health conditions.  Have problems with brain development.  Have difficulty growing.  Have attention and behavior problems later in life.  Do poorly in school and have trouble learning later in life.  Have trouble with vision and coordination.  Be at higher risk for using marijuana by age 93. More research is needed to find out exactly how marijuana affects a baby during breastfeeding. Some studies suggest that the chemicals in marijuana can be passed to a baby through breast milk. To limit possible risks, you should not use marijuana during breastfeeding. General tips and recommendations  Do not use marijuana in any form when you are trying to get pregnant, when you are pregnant, or when you are breastfeeding. If you are having trouble stopping marijuana use, ask your health care provider for help.  If you are using medical marijuana, ask your health care provider to change to a medicine that is safer to use during pregnancy or breastfeeding.  Let your health care provider know if you use marijuana before trying to get pregnant, during pregnancy, or during breastfeeding. Follow these instructions at home:  Do not use any products that contain nicotine or tobacco. These products include cigarettes, chewing tobacco, and vaping devices, such as e-cigarettes. If you need help quitting, ask your health care provider.  Keep all your prenatal visits. This is important.   Where to find more information [employer] on Drug Abuse: www.drugabuse.gov March of Dimes: www.marchofdimes.org/pregnancy Contact a health care provider if:  You use marijuana and want to get pregnant.  You use marijuana during pregnancy or breastfeeding.  You need help stopping marijuana use. Get help right away if:  Your baby is not gaining weight or  growing as expected. Summary  Using marijuana in any form may be harmful for you and your baby when you are trying to become pregnant, during pregnancy, and during breastfeeding. This includes marijuana that is prescribed to you (medical marijuana).  Some studies suggest that marijuana may pass through breast milk and can affect your baby's brain development.  Talk to your health care provider if you use marijuana in any form while trying to get pregnant, during pregnancy, or while breastfeeding.  Ask your health care provider for help if you are not able to stop using marijuana. This information is not intended to replace advice given to you by your health care provider. Make sure you discuss any questions you have with your health care provider. Document Revised: 08/23/2019 Document Reviewed: 08/23/2019 Elsevier Patient Education  2021 [employer].

## 2020-03-14 NOTE — MAU Provider Note (Signed)
History     CSN: [id number]  Arrival date and time: 03/14/20 1459   Event Date/Time   First Provider Initiated Contact with Patient 03/14/20 1620      Chief Complaint  Patient presents with  . Emesis  . Nausea   Hannah Ponce is a 25 y.o. [phone] at 166w2d who presents to MAU for nausea and vomiting. Of note, patient is not actively vomiting while provider is in room for interview.  Onset: one week ago Location: stomach Duration: one week Character: patient reports she has not been able to eat or drink a single thing since Sunday; patient reports she tries to eat or drink something and it comes immediately back up Aggravating/Associated: none/none Relieving: none Treatment: Zofran from previous pregnancy - did not help, Pepto-Bismol  Pt denies VB, vaginal discharge/odor/itching. Pt denies abdominal pain, constipation, diarrhea, or urinary problems. Pt denies fever, chills, fatigue, sweating or changes in appetite. Pt denies SOB or chest pain. Pt denies dizziness, HA, light-headedness, weakness.  Problems this pregnancy include: pt has not yet been seen. Allergies? NKDA Current medications/supplements? Zofran (last took today at 930AM) Prenatal care provider? Pt requests list of OB providers.   OB History    Gravida  4   Para  2   Term  2   Preterm      AB  1   Living  2     SAB      IAB  1   Ectopic      Multiple      Live Births  2           Past Medical History:  Diagnosis Date  . Medical history non-contributory     Past Surgical History:  Procedure Laterality Date  . NO PAST SURGERIES      History reviewed. No pertinent family history.  Social History   Tobacco Use  . Smoking status: Current Every Day Smoker    Packs/day: 0.10    Types: Cigarettes  . Smokeless tobacco: Never Used  Substance Use Topics  . Alcohol use: Not Currently  . Drug use: Yes    Types: Marijuana    Allergies: No Known Allergies  Medications Prior  to Admission  Medication Sig Dispense Refill Last Dose  . [DISCONTINUED] ondansetron (ZOFRAN ODT) 4 MG disintegrating tablet Take 1 tablet (4 mg total) by mouth every 8 (eight) hours as needed for nausea or vomiting. 20 tablet 0     Review of Systems  Constitutional: Negative for chills, diaphoresis, fatigue and fever.  Eyes: Negative for visual disturbance.  Respiratory: Negative for shortness of breath.   Cardiovascular: Negative for chest pain.  Gastrointestinal: Positive for nausea and vomiting. Negative for abdominal pain, constipation and diarrhea.  Genitourinary: Negative for dysuria, flank pain, frequency, pelvic pain, urgency, vaginal bleeding and vaginal discharge.  Neurological: Negative for dizziness, weakness, light-headedness and headaches.   Physical Exam   Blood pressure 138/76, pulse (!) 122, temperature 98.7 F (37.1 C), temperature source Oral, resp. rate 20, height 5\' 3"  (1.6 m), weight 48.1 kg, last menstrual period 01/30/2020, SpO2 100 %.  Patient Vitals for the past 24 hrs:  BP Temp Temp src Pulse Resp SpO2 Height Weight  03/14/20 1523 138/76 98.7 F (37.1 C) Oral (!) 122 20 100 % - -  03/14/20 1518 - - - - - - 5\' 3"  (1.6 m) 48.1 kg   Physical Exam Vitals and nursing note reviewed.  Constitutional:      General: She is  not in acute distress.    Appearance: Normal appearance. She is not ill-appearing, toxic-appearing or diaphoretic.  HENT:     Head: Normocephalic and atraumatic.  Pulmonary:     Effort: Pulmonary effort is normal.  Neurological:     Mental Status: She is alert and oriented to person, place, and time.  Psychiatric:        Mood and Affect: Mood normal.        Behavior: Behavior normal.        Thought Content: Thought content normal.        Judgment: Judgment normal.    Results for orders placed or performed during the hospital encounter of 03/14/20 (from the past 24 hour(s))  Pregnancy, urine POC     Status: Abnormal   Collection Time:  03/14/20  3:34 PM  Result Value Ref Range   Preg Test, Ur POSITIVE (A) NEGATIVE  Urinalysis, Routine w reflex microscopic Urine, Clean Catch     Status: Abnormal   Collection Time: 03/14/20  3:53 PM  Result Value Ref Range   Color, Urine YELLOW YELLOW   APPearance HAZY (A) CLEAR   Specific Gravity, Urine 1.033 (H) 1.005 - 1.030   pH 5.0 5.0 - 8.0   Glucose, UA NEGATIVE NEGATIVE mg/dL   Hgb urine dipstick NEGATIVE NEGATIVE   Bilirubin Urine NEGATIVE NEGATIVE   Ketones, ur 80 (A) NEGATIVE mg/dL   Protein, ur 831 (A) NEGATIVE mg/dL   Nitrite NEGATIVE NEGATIVE   Leukocytes,Ua TRACE (A) NEGATIVE   RBC / HPF 0-5 0 - 5 RBC/hpf   WBC, UA 6-10 0 - 5 WBC/hpf   Bacteria, UA RARE (A) NONE SEEN   Squamous Epithelial / LPF 6-10 0 - 5   Mucus PRESENT   CBC with Differential/Platelet     Status: None   Collection Time: 03/14/20  5:21 PM  Result Value Ref Range   WBC 6.8 4.0 - 10.5 K/uL   RBC 4.90 3.87 - 5.11 MIL/uL   Hemoglobin 14.2 12.0 - 15.0 g/dL   HCT 51.7 61.6 - 07.3 %   MCV 87.8 80.0 - 100.0 fL   MCH 29.0 26.0 - 34.0 pg   MCHC 33.0 30.0 - 36.0 g/dL   RDW 71.0 62.6 - 94.8 %   Platelets 216 150 - 400 K/uL   nRBC 0.0 0.0 - 0.2 %   Neutrophils Relative % 73 %   Neutro Abs 4.9 1.7 - 7.7 K/uL   Lymphocytes Relative 20 %   Lymphs Abs 1.4 0.7 - 4.0 K/uL   Monocytes Relative 7 %   Monocytes Absolute 0.5 0.1 - 1.0 K/uL   Eosinophils Relative 0 %   Eosinophils Absolute 0.0 0.0 - 0.5 K/uL   Basophils Relative 0 %   Basophils Absolute 0.0 0.0 - 0.1 K/uL   Immature Granulocytes 0 %   Abs Immature Granulocytes 0.02 0.00 - 0.07 K/uL  Comprehensive metabolic panel     Status: None   Collection Time: 03/14/20  5:21 PM  Result Value Ref Range   Sodium 136 135 - 145 mmol/L   Potassium 3.6 3.5 - 5.1 mmol/L   Chloride 100 98 - 111 mmol/L   CO2 22 22 - 32 mmol/L   Glucose, Bld 88 70 - 99 mg/dL   BUN 13 6 - 20 mg/dL   Creatinine, Ser 5.46 0.44 - 1.00 mg/dL   Calcium 9.7 8.9 - 27.0 mg/dL    Total Protein 8.1 6.5 - 8.1 g/dL   Albumin 4.3 3.5 -  5.0 g/dL   AST 26 15 - 41 U/L   ALT 21 0 - 44 U/L   Alkaline Phosphatase 40 38 - 126 U/L   Total Bilirubin 0.7 0.3 - 1.2 mg/dL   GFR, Estimated >87 >56 mL/min   Anion gap 14 5 - 15    No results found.  MAU Course  Procedures  MDM -N/V in pregnancy -weight today 48.1kg -UA: hazy/sg1.033/80ketones/100PRO/trace leuks/rare bacteria, sending urine for culture -CBC w/Diff: WNL -CMP: WNL -1L LR + 40mg  Protonix + 10mg  Reglan given, pt reports NV now resolved -pt able to urinate after fluids -PO challenge unsuccessful -Discussed the risk of using Zofran in early pregnancy: Available data suggest that use of Zofran (Ondansetron) in early pregnancy is not associated with a high risk of congenital malformations but a small absolute increase in risk of CV malformations (especially septum defects) and cleft palate may exist. The patient was warned of SE of constipation. Patient desires to proceed with use. -8mg  Zofran + Scopolamine patch given, COVID test ordered, pt reports she smoked marijuana because [employer] told her to for her nausea and vomiting -PO challenge successful -pt discharged to home in stable condition  Orders Placed This Encounter  Procedures  . Culture, OB Urine    Standing Status:   Standing    Number of Occurrences:   1  . SARS CORONAVIRUS 2 (TAT 6-24 HRS) Nasopharyngeal Nasopharyngeal Swab    Standing Status:   Standing    Number of Occurrences:   1    Order Specific Question:   Is this test for diagnosis or screening    Answer:   Diagnosis of ill patient    Order Specific Question:   Symptomatic for COVID-19 as defined by CDC    Answer:   Yes    Order Specific Question:   Date of Symptom Onset    Answer:   03/10/2020    Order Specific Question:   Hospitalized for COVID-19    Answer:   Yes    Order Specific Question:   Admitted to ICU for COVID-19    Answer:   No    Order Specific Question:   Previously tested for  COVID-19    Answer:   Yes    Order Specific Question:   Resident in a congregate (group) care setting    Answer:   Unknown    Order Specific Question:   Employed in healthcare setting    Answer:   Yes    Order Specific Question:   Pregnant    Answer:   Yes    Order Specific Question:   Has patient completed COVID vaccination(s) (2 doses of Pfizer/Moderna 1 dose of )    Answer:   Yes  . OB LESS THAN 14 WEEKS WITH OB TRANSVAGINAL    Standing Status:   Future    Standing Expiration Date:   03/14/2021    Scheduling Instructions:     Please schedule patient for follow-up [employer], Monday - Thursday between 8:00 am - 10:00 am and 12:00 pm - 3:00 pm. The patient will follow-up with [facility] immediately after [place] for results.    Order Specific Question:   Reason for Exam (SYMPTOM  OR DIAGNOSIS REQUIRED)    Answer:   dating    Order Specific Question:   Preferred Imaging Location?    Answer:   [facility]  . Urinalysis, Routine w reflex microscopic Urine, Clean Catch    Standing Status:   Standing  Number of Occurrences:   1  . CBC with Differential/Platelet    Standing Status:   Standing    Number of Occurrences:   1  . Comprehensive metabolic panel    Standing Status:   Standing    Number of Occurrences:   1  . Airborne and Contact precautions    Standing Status:   Standing    Number of Occurrences:   1  . Pregnancy, urine POC    Standing Status:   Standing    Number of Occurrences:   1  . Insert peripheral IV    Standing Status:   Standing    Number of Occurrences:   1  . Discharge patient    Order Specific Question:   Discharge disposition    Answer:   01-Home or Self Care [1]    Order Specific Question:   Discharge patient date    Answer:   03/14/2020   Meds ordered this encounter  Medications  . lactated ringers bolus 1,000 mL  . pantoprazole (PROTONIX) injection 40 mg  . metoCLOPramide (REGLAN) injection 10 mg  . ondansetron (ZOFRAN) 8 mg in sodium  chloride 0.9 % 50 mL IVPB  . scopolamine (TRANSDERM-SCOP) 1 MG/3DAYS 1.5 mg  . lactated ringers bolus 500 mL  . scopolamine (TRANSDERM-SCOP) 1 MG/3DAYS    Sig: Place 1 patch (1.5 mg total) onto the skin every 3 (three) days.    Dispense:  10 patch    Refill:  12    Order Specific Question:   Supervising Provider    Answer:   Levie HeritageSTINSON, JACOB J [[id number]]  . ondansetron (ZOFRAN ODT) 4 MG disintegrating tablet    Sig: Take 1 tablet (4 mg total) by mouth every 8 (eight) hours as needed for nausea or vomiting.    Dispense:  30 tablet    Refill:  1    Order Specific Question:   Supervising Provider    Answer:   Levie HeritageSTINSON, JACOB J [[id number]]  . famotidine (PEPCID) 20 MG tablet    Sig: Take 1 tablet (20 mg total) by mouth daily.    Dispense:  30 tablet    Refill:  1    Order Specific Question:   Supervising Provider    Answer:   Levie HeritageSTINSON, JACOB J [[id number]]  . metoCLOPramide (REGLAN) 10 MG tablet    Sig: Take 1 tablet (10 mg total) by mouth 3 (three) times daily with meals.    Dispense:  90 tablet    Refill:  1    Order Specific Question:   Supervising Provider    Answer:   Levie HeritageSTINSON, JACOB J [[id number]]    Assessment and Plan   1. Nausea and vomiting in pregnancy   2. Marijuana use     Allergies as of 03/14/2020   No Known Allergies     Medication List    TAKE these medications   famotidine 20 MG tablet Commonly known as: Pepcid Take 1 tablet (20 mg total) by mouth daily.   metoCLOPramide 10 MG tablet Commonly known as: REGLAN Take 1 tablet (10 mg total) by mouth 3 (three) times daily with meals.   ondansetron 4 MG disintegrating tablet Commonly known as: Zofran ODT Take 1 tablet (4 mg total) by mouth every 8 (eight) hours as needed for nausea or vomiting.   scopolamine 1 MG/3DAYS Commonly known as: TRANSDERM-SCOP Place 1 patch (1.5 mg total) onto the skin every 3 (three) days. Start taking on: March 17, 2020       -  will call with culture results, if positive -outpatient [place]  ordered -pt advised not to restart smoking marijuana for N/V or other reason as this can worsen symptoms of N/V, information given on cannabis hyperemesis -safe meds in pregnancy list given -list of OB providers given -pt advised to take medications around the clock and not to stop taking if feeling better -Zofran to be used PRN -discussed nonpharmacologic and pharmacologic treatments of N/V -discussed normal expectations for N/V in pregnancy -return MAU precautions given -pt discharged to home in stable condition  Hannah Ponce 03/14/2020, 8:53 PM

## 2020-03-15 LAB — SARS CORONAVIRUS 2 (TAT 6-24 HRS): SARS Coronavirus 2: POSITIVE — AB

## 2020-03-16 ENCOUNTER — Inpatient Hospital Stay (HOSPITAL_COMMUNITY)
Admission: AD | Admit: 2020-03-16 | Discharge: 2020-03-16 | Disposition: A | Discharge status: Home / Self Care | Payer: Medicaid Other | Attending: Obstetrics and Gynecology | Admitting: Obstetrics and Gynecology

## 2020-03-16 ENCOUNTER — Encounter (HOSPITAL_COMMUNITY): Payer: Self-pay | Admitting: Obstetrics and Gynecology

## 2020-03-16 DIAGNOSIS — U071 COVID-19: Secondary | ICD-10-CM | POA: Insufficient documentation

## 2020-03-16 DIAGNOSIS — Z3A01 Less than 8 weeks gestation of pregnancy: Secondary | ICD-10-CM | POA: Diagnosis not present

## 2020-03-16 DIAGNOSIS — O219 Vomiting of pregnancy, unspecified: Secondary | ICD-10-CM

## 2020-03-16 DIAGNOSIS — O210 Mild hyperemesis gravidarum: Secondary | ICD-10-CM | POA: Insufficient documentation

## 2020-03-16 DIAGNOSIS — O98511 Other viral diseases complicating pregnancy, first trimester: Secondary | ICD-10-CM | POA: Diagnosis not present

## 2020-03-16 DIAGNOSIS — Z87891 Personal history of nicotine dependence: Secondary | ICD-10-CM | POA: Insufficient documentation

## 2020-03-16 LAB — URINALYSIS, ROUTINE W REFLEX MICROSCOPIC
Bilirubin Urine: NEGATIVE
Glucose, UA: NEGATIVE mg/dL
Hgb urine dipstick: NEGATIVE
Ketones, ur: 80 mg/dL — AB
Nitrite: NEGATIVE
Protein, ur: 100 mg/dL — AB
Specific Gravity, Urine: 1.031 — ABNORMAL HIGH (ref 1.005–1.030)
pH: 6 (ref 5.0–8.0)

## 2020-03-16 LAB — CULTURE, OB URINE

## 2020-03-16 MED ORDER — M.V.I. ADULT IV INJ
Freq: Once | INTRAVENOUS | Status: AC
  Filled 2020-03-16: qty 10

## 2020-03-16 MED ORDER — PROMETHAZINE HCL 25 MG/ML IJ SOLN
12.5000 mg | Freq: Once | INTRAMUSCULAR | Status: AC
  Administered 2020-03-16: 12.5 mg via INTRAVENOUS
  Filled 2020-03-16: qty 1

## 2020-03-16 MED ORDER — LACTATED RINGERS IV BOLUS
1000.0000 mL | Freq: Once | INTRAVENOUS | Status: AC
  Administered 2020-03-16: 1000 mL via INTRAVENOUS

## 2020-03-16 MED ORDER — ONDANSETRON HCL 4 MG/2ML IJ SOLN
4.0000 mg | Freq: Once | INTRAMUSCULAR | Status: AC
  Administered 2020-03-16: 4 mg via INTRAVENOUS
  Filled 2020-03-16: qty 2

## 2020-03-16 MED ORDER — PROMETHAZINE HCL 25 MG RE SUPP: 25.0000 mg | Freq: Four times a day (QID) | RECTAL | 0 refills | Status: DC | PRN

## 2020-03-16 MED ORDER — METHYLPREDNISOLONE 8 MG PO TABS: 8.0000 mg | ORAL_TABLET | Freq: Every day | ORAL | 0 refills | Status: DC

## 2020-03-16 MED ORDER — METHYLPREDNISOLONE 16 MG PO TABS: 16.0000 mg | ORAL_TABLET | Freq: Every day | ORAL | 0 refills | Status: AC

## 2020-03-16 MED ORDER — PANTOPRAZOLE SODIUM 40 MG IV SOLR
40.0000 mg | Freq: Once | INTRAVENOUS | Status: AC
  Administered 2020-03-16: 40 mg via INTRAVENOUS
  Filled 2020-03-16: qty 40

## 2020-03-16 MED ORDER — SODIUM CHLORIDE 0.9 % IV SOLN: 8.0000 mg | Freq: Once | INTRAVENOUS | Status: DC

## 2020-03-16 MED ORDER — METHYLPREDNISOLONE 4 MG PO TABS: 4.0000 mg | ORAL_TABLET | Freq: Every day | ORAL | 0 refills | Status: AC

## 2020-03-16 MED ORDER — METHYLPREDNISOLONE SODIUM SUCC 125 MG IJ SOLR
48.0000 mg | Freq: Once | INTRAMUSCULAR | Status: AC
  Administered 2020-03-16: 48 mg via INTRAVENOUS
  Filled 2020-03-16: qty 2

## 2020-03-16 NOTE — MAU Provider Note (Signed)
History     CSN: [id number]  Arrival date and time: 03/16/20 0825   Event Date/Time   First Provider Initiated Contact with Patient 03/16/20 [phone]      Chief Complaint  Patient presents with  . Nausea  . Emesis   Hannah Ponce is a 25 y.o. [id number] at [redacted]w[redacted]d by Definite LMP of 01/30/2020.  She presents today for Nausea and Emesis.  She states she was seen and evaluated a few days ago for the same symptoms.  Patient states she has not been able to keep anything down.  She states she has been taking "the medications they prescribed me, but once I swallow it down it comes right back up."  Patient reports last does of medication was at 10 or 11pm.  She reports she tried to eat crackers and rice last night, but was unsuccessful. She states she drinks and "it comes right back." Of note, patient laying in bed, tearful.    OB History    Gravida  4   Para  2   Term  2   Preterm      AB  1   Living  2     SAB      IAB  1   Ectopic      Multiple      Live Births  2           Past Medical History:  Diagnosis Date  . Medical history non-contributory     Past Surgical History:  Procedure Laterality Date  . NO PAST SURGERIES      Family History  Problem Relation Age of Onset  . Healthy Mother     Social History   Tobacco Use  . Smoking status: Former Smoker    Packs/day: 0.10    Types: Cigarettes  . Smokeless tobacco: Never Used  Vaping Use  . Vaping Use: Never used  Substance Use Topics  . Alcohol use: Not Currently  . Drug use: Yes    Types: Marijuana    Comment: Last smoked January 2022    Allergies: No Known Allergies  Medications Prior to Admission  Medication Sig Dispense Refill Last Dose  . famotidine (PEPCID) 20 MG tablet Take 1 tablet (20 mg total) by mouth daily. 30 tablet 1 03/15/2020 at 2200  . metoCLOPramide (REGLAN) 10 MG tablet Take 1 tablet (10 mg total) by mouth 3 (three) times daily with meals. 90 tablet 1 03/15/2020 at 2200  .  ondansetron (ZOFRAN ODT) 4 MG disintegrating tablet Take 1 tablet (4 mg total) by mouth every 8 (eight) hours as needed for nausea or vomiting. 30 tablet 1   . [START ON 03/17/2020] scopolamine (TRANSDERM-SCOP) 1 MG/3DAYS Place 1 patch (1.5 mg total) onto the skin every 3 (three) days. 10 patch 12 03/14/2020 at Unknown time    Review of Systems  Constitutional: Positive for chills. Negative for fever.  Respiratory: Positive for cough. Negative for shortness of breath.   Gastrointestinal: Positive for abdominal pain, nausea and vomiting.  Genitourinary: Positive for pelvic pain (10/10). Negative for difficulty urinating, dysuria, vaginal bleeding and vaginal discharge.  Neurological: Positive for light-headedness. Negative for dizziness and headaches.   Physical Exam   Blood pressure (!) 99/45, pulse 71, temperature 98.8 F (37.1 C), temperature source Oral, resp. rate 20, height 5\' 3"  (1.6 m), weight 47.6 kg, last menstrual period 01/30/2020, SpO2 100 %.  Physical Exam Constitutional:      Appearance: Normal appearance. She is ill-appearing.  HENT:  Head: Normocephalic and atraumatic.  Eyes:     Conjunctiva/sclera: Conjunctivae normal.  Cardiovascular:     Rate and Rhythm: Normal rate and regular rhythm.     Heart sounds: Normal heart sounds.  Pulmonary:     Effort: Pulmonary effort is normal. No respiratory distress.     Breath sounds: Normal breath sounds.  Abdominal:     General: Abdomen is flat.     Tenderness: There is abdominal tenderness in the epigastric area.  Musculoskeletal:        General: Normal range of motion.     Cervical back: Normal range of motion.  Skin:    General: Skin is warm and dry.  Neurological:     Mental Status: She is alert and oriented to person, place, and time.  Psychiatric:        Mood and Affect: Mood normal.        Behavior: Behavior normal.        Thought Content: Thought content normal.     MAU Course  Procedures Results for  orders placed or performed during the hospital encounter of 03/16/20 (from the past 24 hour(s))  Urinalysis, Routine w reflex microscopic Urine, Clean Catch     Status: Abnormal   Collection Time: 03/16/20 10:35 AM  Result Value Ref Range   Color, Urine YELLOW YELLOW   APPearance HAZY (A) CLEAR   Specific Gravity, Urine 1.031 (H) 1.005 - 1.030   pH 6.0 5.0 - 8.0   Glucose, UA NEGATIVE NEGATIVE mg/dL   Hgb urine dipstick NEGATIVE NEGATIVE   Bilirubin Urine NEGATIVE NEGATIVE   Ketones, ur 80 (A) NEGATIVE mg/dL   Protein, ur 250 (A) NEGATIVE mg/dL   Nitrite NEGATIVE NEGATIVE   Leukocytes,Ua TRACE (A) NEGATIVE   RBC / HPF 0-5 0 - 5 RBC/hpf   WBC, UA 6-10 0 - 5 WBC/hpf   Bacteria, UA RARE (A) NONE SEEN   Squamous Epithelial / LPF 6-10 0 - 5   Mucus PRESENT     MDM Start IV LR Bolus f/b Banana Bag Antiemetic PPI Assessment and Plan  25 year old [id number] at 6.4 weeks N/V Covid Positive  -POC Reviewed. -Exam performed and findings discussed.  -Reassured that abdominal pain is epigastric and likely related to recurrent vomiting. -Discussed that symptoms may be exacerbated d/t Covid diagnosis. -Patient unable to urinate.  States last emesis upon arrival. -Will start IV and give LR bolus f/b MVI. -Will also give Phenergan and Protonix. -Monitor and reassess as appropriate.  Cherre Robins 03/16/2020, 9:40 AM   Reassessment (10:37 AM)  Medications and fluids given. Patient unable to urinate. No incidents of vomiting.  Will order additional IV fluids and MVI.  Reassessment (12:11 PM) Vitals:   03/16/20 0903 03/16/20 1202  BP: (!) 99/45 121/85  Pulse: 71 (!) 115  Resp: 20 18  Temp: 98.8 F (37.1 C) 99.7 F (37.6 C)  SpO2: 100% 100%    -Provider to bedside and patient reports no improvement in symptoms. -However, no reports of vomiting per nurse. -Patient urine specimen reviewed and with 80+ ketones. -Review of previous labs shows h/o THC usage.  Will add-on rapid  drug screen. -Discussed initiation of medrol taper and discussed how used for treatment of severe N/V. Patient agreeable.  Will give IV dosing now.   Reassessment (2:10 PM) -Nurse reports oral challenge attempted without success. -Will give 4mg  Zofran IVP  Reassessment (3:05 PM) -Provider to bedside. -Patient states that she feels fatigued and was unable to  keep down crackers. -However, provider observes improved mood and affect. -Provider discussed discharge and medications to include phenergan suppository and Medrol taper.  -Given written instructions for steroid usage. -Discussed eating small frequent bland meals throughout the day and small sips of fluid. -Discussed usage of Ensure/Boost which patient reports she has at home.  -Encouraged to call or return to MAU if symptoms worsen or with the onset of new symptoms. -Discharged to home in stable condition.  Cherre Robins MSN, CNM Advanced Practice Provider, [facility]

## 2020-03-16 NOTE — Discharge Instructions (Signed)
STEROID TAPER  Take 16mg  tablet daily for 3 days starting 03/17/2020. Take 8mg  tablet daily for 3 days starting 03/20/2020. Take 4mg  tablet daily for 7 days starting 03/23/2020.  Hyperemesis Gravidarum Hyperemesis gravidarum is a severe form of nausea and vomiting that happens during pregnancy. Hyperemesis is worse than morning sickness. It may cause you to have nausea or vomiting all day for many days. It may keep you from eating and drinking enough food and liquids, which can lead to dehydration, malnutrition, and weight loss. Hyperemesis usually occurs during the first half (the first 20 weeks) of pregnancy. It often goes away once a woman is in her second half of pregnancy. However, sometimes hyperemesis continues through an entire pregnancy. What are the causes? The cause of this condition is not known. It may be associated with:  Changes in hormones in the body during pregnancy.  Changes in the gastrointestinal system.  Genetic or inherited conditions. What are the signs or symptoms? Symptoms of this condition include:  Severe nausea and vomiting that does not go away.  Problems keeping food down.  Weight loss.  Loss of body fluid (dehydration).  Loss of appetite. You may have no desire to eat or you may not like the food you have previously enjoyed. How is this diagnosed? This condition may be diagnosed based on your medical history, your symptoms, and a physical exam. You may also have other tests, including:  Blood tests.  Urine tests.  Blood pressure tests.  Ultrasound to look for problems with the placenta or to check if you are pregnant with more than one baby. How is this treated? This condition is managed by controlling symptoms. This may include:  Following an eating plan. This can help to lessen nausea and vomiting.  Treatments that do not use medicine. These include acupressure bracelets, hypnosis, and eating or drinking foods or fluids that contain ginger,  ginger ale, or ginger tea.  Taking prescription medicine or over-the-counter medicine as told by your health care provider.  Continuing to take prenatal vitamins. You may need to change what kind you take and when you take them. Follow your health care provider's instructions about prenatal vitamins. An eating plan and medicines are often used together to help control symptoms. If medicines do not help relieve nausea and vomiting, you may need to receive fluids through an IV at the hospital. Follow these instructions at home: To help relieve your symptoms, listen to your body. Everyone is different and has different preferences. Find what works best for you. Here are some things you can try to help relieve your symptoms: Meals and snacks  Eat 5-6 small meals daily instead of 3 large meals. Eating small meals and snacks can help you avoid an empty stomach.  Before getting out of bed, eat a couple of crackers to avoid moving around on an empty stomach.  Eat a protein-rich snack before bed. Examples include cheese and crackers, or a peanut butter sandwich made with 1 slice of whole-wheat bread and 1 tsp (5 g) of peanut butter.  Eat and drink slowly.  Try eating starchy foods as these are usually tolerated well. Examples include cereal, toast, bread, potatoes, pasta, rice, and pretzels.  Eat at least one serving of protein with your meals and snacks. Protein options include lean meats, poultry, seafood, beans, nuts, nut butters, eggs, cheese, and yogurt.  Eat or suck on things that have ginger in them. It may help to relieve nausea. Add  tsp (0.44 g) ground  ginger to hot tea, or choose ginger tea.   Fluids It is important to stay hydrated. Try to:  Drink small amounts of fluids often.  Drink fluids 30 minutes before or after a meal to help lessen the feeling of a full stomach.  Drink 100% fruit juice or an electrolyte drink. An electrolyte drink contains sodium, potassium, and  chloride.  Drink fluids that are cold, clear, and carbonated or sour. These include lemonade, ginger ale, lemon-lime soda, ice water, and sparkling water. Things to avoid Avoid the following:  Eating foods that trigger your symptoms. These may include spicy foods, coffee, high-fat foods, very sweet foods, and acidic foods.  Drinking more than 1 cup of fluid at a time.  Skipping meals. Nausea can be more intense on an empty stomach. If you cannot tolerate food, do not force it. Try sucking on ice chips or other frozen items and make up for missed calories later.  Lying down within 2 hours after eating.  Being exposed to environmental triggers. These may include food smells, smoky rooms, closed spaces, rooms with strong smells, warm or humid places, overly loud and noisy rooms, and rooms with motion or flickering lights. Try eating meals in a well-ventilated area that is free of strong smells.  Making quick and sudden changes in your movement.  Taking iron pills and multivitamins that contain iron. If you take prescription iron pills, do not stop taking them unless your health care provider approves.  Preparing food. The smell of food can spoil your appetite or trigger nausea. General instructions  Brush your teeth or use a mouth rinse after meals.  Take over-the-counter and prescription medicines only as told by your health care provider.  Follow instructions from your health care provider about eating or drinking restrictions.  Talk with your health care provider about starting a supplement of vitamin B6.  Continue to take your prenatal vitamins as told by your health care provider. If you are having trouble taking your prenatal vitamins, talk with your health care provider about other options.  Keep all follow-up visits. This is important. Follow-up visits include prenatal visits. Contact a health care provider if:  You have pain in your abdomen.  You have a severe  headache.  You have vision problems.  You are losing weight.  You feel weak or dizzy.  You cannot eat or drink without vomiting, especially if this goes on for a full day. Get help right away if:  You cannot drink fluids without vomiting.  You vomit blood.  You have constant nausea and vomiting.  You are very weak.  You faint.  You have a fever and your symptoms suddenly get worse. Summary  Hyperemesis gravidarum is a severe form of nausea and vomiting that happens during pregnancy.  Making some changes to your eating habits may help relieve nausea and vomiting.  This condition may be managed with lifestyle changes and medicines as prescribed by your health care provider.  If medicines do not help relieve nausea and vomiting, you may need to receive fluids through an IV at the hospital. This information is not intended to replace advice given to you by your health care provider. Make sure you discuss any questions you have with your health care provider. Document Revised: 09/13/2019 Document Reviewed: 09/13/2019 Elsevier Patient Education  2021 [employer].

## 2020-03-16 NOTE — MAU Note (Signed)
Reports she has been having n/v for weeks. Was in MAU on 1/11. Can't keep nausea medications down. Unable to eat or drink. Reports burning in her chest and abd cramping with vomiing.

## 2020-03-22 ENCOUNTER — Encounter (HOSPITAL_COMMUNITY): Payer: Self-pay | Admitting: Obstetrics and Gynecology

## 2020-03-28 ENCOUNTER — Ambulatory Visit
Admission: RE | Admit: 2020-03-28 | Discharge: 2020-03-28 | Disposition: A | Discharge status: Home / Self Care | Payer: Medicaid Other | Source: Ambulatory Visit | Attending: Women's Health | Admitting: Women's Health

## 2020-03-28 ENCOUNTER — Other Ambulatory Visit: Payer: Self-pay

## 2020-03-28 DIAGNOSIS — O219 Vomiting of pregnancy, unspecified: Secondary | ICD-10-CM | POA: Insufficient documentation

## 2020-04-14 ENCOUNTER — Telehealth: Payer: Medicaid Other | Admitting: Family Medicine

## 2020-04-14 ENCOUNTER — Encounter: Payer: Self-pay | Admitting: Family Medicine

## 2020-04-14 DIAGNOSIS — M2550 Pain in unspecified joint: Secondary | ICD-10-CM

## 2020-04-14 DIAGNOSIS — Z3A10 10 weeks gestation of pregnancy: Secondary | ICD-10-CM | POA: Diagnosis not present

## 2020-04-14 NOTE — Progress Notes (Signed)
Ms. Hannah Ponce, Hannah Ponce are scheduled for a virtual visit with your provider today.    Just as we do with appointments in the office, we must obtain your consent to participate.  Your consent will be active for this visit and any virtual visit you may have with one of our providers in the next 365 days.    If you have a MyChart account, I can also send a copy of this consent to you electronically.  All virtual visits are billed to your insurance company just like a traditional visit in the office.  As this is a virtual visit, video technology does not allow for your provider to perform a traditional examination.  This may limit your provider's ability to fully assess your condition.  If your provider identifies any concerns that need to be evaluated in person or the need to arrange testing such as labs, EKG, etc, we will make arrangements to do so.    Although advances in technology are sophisticated, we cannot ensure that it will always work on either your end or our end.  If the connection with a video visit is poor, we may have to switch to a telephone visit.  With either a video or telephone visit, we are not always able to ensure that we have a secure connection.   I need to obtain your verbal consent now.   Are you willing to proceed with your visit today?   LANDRY LOOKINGBILL has provided verbal consent on 04/14/2020 for a virtual visit (video or telephone).   Freddy Finner, NP 04/14/2020  3:38 PM   Date:  04/14/2020   ID:  Jamelle Rushing, DOB 1995/09/01, MRN [id number]  Patient Location: Home Provider Location: Home Office   Participants: Patient and Provider for Visit and Wrap up  Method of visit: Video  Location of Patient: Home Location of Provider: Home Office Consent was obtain for visit over the video. Services rendered by provider: Visit was performed via video  A video enabled telemedicine application was used and I verified that I am speaking with the correct person using two  identifiers.  PCP:  Patient, No Pcp Per   Chief Complaint:  Joint pains   History of Present Illness:    Hannah Ponce is a 25 y.o. female with history as stated below. Presents video telehealth for an acute care visit joint pains- stiffness and locking up. Has been going on now for 2-3 weeks, overall ok during the day, but at bedtime she notes the  Pains return and she has joint locking. She reports this makes it hard for her to rest.  Of note:  +COVID and hyperemesis during the first month of so of this pregnancy. Has never had this joint or pain issue in other pregnancies.  Has not been seen by OB yet, due to insurance changes, does have an appt coming up.  She has tried warm showers, baths, and tylenol and heating pads. Is trying to eat well As she loss some weight while sick during that first month. She is increasing her bananas and other healthier foods and water intake.  Past Medical, Surgical, Social History, Allergies, and Medications have been Reviewed.  Past Medical History:  Diagnosis Date  . Medical history non-contributory     No outpatient medications have been marked as taking for the 04/14/20 encounter (Video Visit) with Freddy Finner, NP.     Allergies:   Patient has no known allergies.   ROS See HPI  for history of present illness.  Physical Exam Constitutional:      Appearance: Normal appearance.  HENT:     Head: Normocephalic and atraumatic.     Right Ear: External ear normal.     Left Ear: External ear normal.     Nose: Nose normal.  Eyes:     Extraocular Movements: Extraocular movements intact.     Conjunctiva/sclera: Conjunctivae normal.     Pupils: Pupils are equal, round, and reactive to light.  Pulmonary:     Comments: No shortness of breath in conversation Musculoskeletal:        General: Normal range of motion.     Cervical back: Normal range of motion.  Neurological:     Mental Status: She is alert and oriented to person, place, and  time.  Psychiatric:        Mood and Affect: Mood normal.        Behavior: Behavior normal.        Thought Content: Thought content normal.        Judgment: Judgment normal.               1. Arthralgia, unspecified joint -unsure if pregnancy related, highly suspect so given hormone changes -advised to get appt with OB for specialized treatment measures -encouraged tylenol and continue warmth to joints, yoga and stretching and staying active  2. [redacted] weeks gestation of pregnancy -10 w and 5 d pregnant with joint aches -tylenol, heating pad, rest, well balanced diet, and exercise encouraged- walking and stretching) encouraged to call OB and get an appt as soon as possible    Time:   Today, I have spent 10 minutes with the patient with telehealth technology discussing the above problems, reviewing the chart, previous notes, medications and orders.    Tests Ordered: No orders of the defined types were placed in this encounter.   Medication Changes: No orders of the defined types were placed in this encounter.    Disposition:  Follow up as needed with OB Signed, Freddy Finner, NP  04/14/2020 3:38 PM

## 2020-04-14 NOTE — Patient Instructions (Addendum)
I appreciate the opportunity to provide you with care for your health and wellness.  Follow up: with OB  -tylenol, heating pad, rest, well balanced diet, and exercise encouraged- walking and stretching  - call OB and get an appt as soon as possible  Congratulations and Best Wishes  Please continue to practice social distancing to keep you, your family, and our community safe.  If you must go out, please wear a mask and practice good handwashing.      ]

## 2020-04-20 ENCOUNTER — Encounter: Payer: Medicaid Other | Admitting: Student

## 2020-04-24 ENCOUNTER — Other Ambulatory Visit: Payer: Self-pay

## 2020-04-24 ENCOUNTER — Other Ambulatory Visit (HOSPITAL_COMMUNITY)
Admission: RE | Admit: 2020-04-24 | Discharge: 2020-04-24 | Disposition: A | Discharge status: Home / Self Care | Payer: Medicaid Other | Source: Ambulatory Visit | Attending: Student | Admitting: Student

## 2020-04-24 ENCOUNTER — Ambulatory Visit (INDEPENDENT_AMBULATORY_CARE_PROVIDER_SITE_OTHER): Payer: Medicaid Other | Admitting: Nurse Practitioner

## 2020-04-24 ENCOUNTER — Encounter: Payer: Self-pay | Admitting: Nurse Practitioner

## 2020-04-24 VITALS — BP 112/73 | HR 104 | Wt 103.1 lb

## 2020-04-24 DIAGNOSIS — Z3401 Encounter for supervision of normal first pregnancy, first trimester: Secondary | ICD-10-CM | POA: Diagnosis not present

## 2020-04-24 DIAGNOSIS — O219 Vomiting of pregnancy, unspecified: Secondary | ICD-10-CM

## 2020-04-24 DIAGNOSIS — Z3A12 12 weeks gestation of pregnancy: Secondary | ICD-10-CM

## 2020-04-24 DIAGNOSIS — K117 Disturbances of salivary secretion: Secondary | ICD-10-CM

## 2020-04-24 MED ORDER — GLYCOPYRROLATE 2 MG PO TABS: 2.0000 mg | ORAL_TABLET | Freq: Three times a day (TID) | ORAL | 0 refills | Status: DC

## 2020-04-24 MED ORDER — DOXYLAMINE-PYRIDOXINE 10-10 MG PO TBEC: DELAYED_RELEASE_TABLET | ORAL | 2 refills | Status: DC

## 2020-04-24 NOTE — Progress Notes (Signed)
Subjective:   Hannah Ponce is a 25 y.o. [phone] at [redacted]w[redacted]d by LMP being seen today for her first obstetrical visit.  Her obstetrical history is significant for nausea and vomiting. Patient does intend to breast feed. Pregnancy history fully reviewed.  Patient reports body aches, elbows lock up, especially at night.  HISTORY: OB History  Gravida Para Term Preterm AB Living  4 2 2  0 1 2  SAB IAB Ectopic Multiple Live Births  0 1 0 0 2    # Outcome Date GA Lbr Len/2nd Weight Sex Delivery Anes PTL Lv  4 Current           3 Term 09/30/15     Vag-Spont   LIV  2 Term 10/26/12     Vag-Spont   LIV  1 IAB            Past Medical History:  Diagnosis Date  . Medical history non-contributory    Past Surgical History:  Procedure Laterality Date  . NO PAST SURGERIES     Family History  Problem Relation Age of Onset  . Healthy Mother    Social History   Tobacco Use  . Smoking status: Former Smoker    Packs/day: 0.10    Types: Cigarettes  . Smokeless tobacco: Never Used  Vaping Use  . Vaping Use: Never used  Substance Use Topics  . Alcohol use: Not Currently  . Drug use: Yes    Types: Marijuana    Comment: Last smoked January 2022   No Known Allergies Current Outpatient Medications on File Prior to Visit  Medication Sig Dispense Refill  . Prenatal MV & Min w/FA-DHA (PRENATAL GUMMIES PO) Take by mouth.    . famotidine (PEPCID) 20 MG tablet Take 1 tablet (20 mg total) by mouth daily. (Patient not taking: Reported on 04/24/2020) 30 tablet 1  . methylPREDNISolone (MEDROL) 8 MG tablet Take 1 tablet (8 mg total) by mouth daily. (Patient not taking: Reported on 04/24/2020) 3 tablet 0  . metoCLOPramide (REGLAN) 10 MG tablet Take 1 tablet (10 mg total) by mouth 3 (three) times daily with meals. (Patient not taking: Reported on 04/24/2020) 90 tablet 1  . promethazine (PHENERGAN) 25 MG suppository Place 1 suppository (25 mg total) rectally every 6 (six) hours as needed for nausea or  vomiting. (Patient not taking: Reported on 04/24/2020) 12 each 0  . scopolamine (TRANSDERM-SCOP) 1 MG/3DAYS Place 1 patch (1.5 mg total) onto the skin every 3 (three) days. (Patient not taking: Reported on 04/24/2020) 10 patch 12   No current facility-administered medications on file prior to visit.     Exam   Vitals:   04/24/20 1525  BP: 112/73  Pulse: (!) 104  Weight: 103 lb 1.6 oz (46.8 kg)   Fetal Heart Rate (bpm): 165  Uterus:     Pelvic Exam: Perineum: no hemorrhoids, normal perineum   Vulva: normal external genitalia, no lesions   Vagina:  normal mucosa, normal discharge   Cervix: no lesions and normal, pap smear done.    Adnexa: normal adnexa and no mass, fullness, tenderness   Bony Pelvis: average  System: General: well-developed, well-nourished female in no acute distress   Breast:  normal appearance, no masses or tenderness   Skin: normal coloration and turgor, no rashes   Neurologic: oriented, normal, negative, normal mood   Extremities: normal strength, tone, and muscle mass, ROM of all joints is normal   HEENT extraocular movement intact and sclera clear, anicteric  Mouth/Teeth deferred   Neck supple and no masses, normal thyroid   Cardiovascular: regular rate and rhythm   Respiratory:  no respiratory distress, normal breath sounds   Abdomen: soft, non-tender; no masses,  no organomegaly     Assessment:   Pregnancy: [id number] Patient Active Problem List   Diagnosis Date Noted  . Supervision of low-risk first pregnancy, first trimester 04/24/2020  . Arthralgia 04/14/2020     Plan:  1. Supervision of low-risk first pregnancy, first trimester Reviewed MyChart and Babyscripts app Does not really want more children (unless perhaps this baby is a boy) Discussed LARC as she is not interested in BTL - may be interested in Nexplanon.  Advised to consider IUD as well and info put in AVS.  - [facility] AMB BABYSCRIPTS SCHEDULE OPTIMIZATION - Culture, OB Urine -  CBC/D/Plt+RPR+Rh+ABO+Rub Ab... - Genetic Screening - US MFM OB COMP + 14 WK; Future - Cytology - PAP( [facility])  2. Nausea and vomiting in pregnancy Is getting better but still almost daily and has had weight loss although weight has been stable since mid January Has smoked marijuana once but has not continued.  Advised not to continue marijuana and it may prolong the vomiting. Will try Diclegis to stop the vomiting. Now is having spitting in addition to the vomiting.  3. Ptyalism States she carries a cup with her for spitting but did not have one at her visit that I saw.   Will try Robinul to help.  Initial labs drawn. Continue prenatal vitamins. Genetic Screening discussed, NIPS: ordered. Ultrasound discussed; fetal anatomic survey: ordered. Problem list reviewed and updated. The nature of [facility] with multiple MDs and other Advanced Practice Providers was explained to patient; also emphasized that residents, students are part of our team. Routine obstetric precautions reviewed. Return in about 4 weeks (around 05/22/2020) for In person ROB.  Total face-to-face time with patient: 40 minutes.  Over 50% of encounter was spent on counseling and coordination of care.     Nolene Bernheim, FNP Family Nurse Practitioner, [facility] for [employer], [facility] 04/24/2020 4:04 PM

## 2020-04-24 NOTE — Patient Instructions (Addendum)
https://www.cdc.gov/pregnancy/infections.html">  First Trimester of Pregnancy  The first trimester of pregnancy starts on the first day of your last menstrual period until the end of week 12. This is also called months 1 through 3 of pregnancy. Body changes during your first trimester Your body goes through many changes during pregnancy. The changes usually return to normal after your baby is born. Physical changes  You may gain or lose weight.  Your breasts may grow larger and hurt. The area around your nipples may get darker.  Dark spots or blotches may develop on your face.  You may have changes in your hair. Health changes  You may feel like you might vomit (nauseous), and you may vomit.  You may have heartburn.  You may have headaches.  You may have trouble pooping (constipation).  Your gums may bleed. Other changes  You may get tired easily.  You may pee (urinate) more often.  Your menstrual periods will stop.  You may not feel hungry.  You may want to eat certain kinds of food.  You may have changes in your emotions from day to day.  You may have more dreams. Follow these instructions at home: Medicines  Take over-the-counter and prescription medicines only as told by your doctor. Some medicines are not safe during pregnancy.  Take a prenatal vitamin that contains at least 600 micrograms (mcg) of folic acid. Eating and drinking  Eat healthy meals that include: ? Fresh fruits and vegetables. ? Whole grains. ? Good sources of protein, such as meat, eggs, or tofu. ? Low-fat dairy products.  Avoid raw meat and unpasteurized juice, milk, and cheese.  If you feel like you may vomit, or you vomit: ? Eat 4 or 5 small meals a day instead of 3 large meals. ? Try eating a few soda crackers. ? Drink liquids between meals instead of during meals.  You may need to take these actions to prevent or treat trouble pooping: ? Drink enough fluids to keep your pee  (urine) pale yellow. ? Eat foods that are high in fiber. These include beans, whole grains, and fresh fruits and vegetables. ? Limit foods that are high in fat and sugar. These include fried or sweet foods. Activity  Exercise only as told by your doctor. Most people can do their usual exercise routine during pregnancy.  Stop exercising if you have cramps or pain in your lower belly (abdomen) or low back.  Do not exercise if it is too hot or too humid, or if you are in a place of great height (high altitude).  Avoid heavy lifting.  If you choose to, you may have sex unless your doctor tells you not to. Relieving pain and discomfort  Wear a good support bra if your breasts are sore.  Rest with your legs raised (elevated) if you have leg cramps or low back pain.  If you have bulging veins (varicose veins) in your legs: ? Wear support hose as told by your doctor. ? Raise your feet for 15 minutes, 3-4 times a day. ? Limit salt in your food. Safety  Wear your seat belt at all times when you are in a car.  Talk with your doctor if someone is hurting you or yelling at you.  Talk with your doctor if you are feeling sad or have thoughts of hurting yourself. Lifestyle  Do not use hot tubs, steam rooms, or saunas.  Do not douche. Do not use tampons or scented sanitary pads.  Do not   use herbal medicines, illegal drugs, or medicines that are not approved by your doctor. Do not drink alcohol.  Do not smoke or use any products that contain nicotine or tobacco. If you need help quitting, ask your doctor.  Avoid cat litter boxes and soil that is used by cats. These carry germs that can cause harm to the baby and can cause a loss of your baby by miscarriage or stillbirth. General instructions  Keep all follow-up visits. This is important.  Ask for help if you need counseling or if you need help with nutrition. Your doctor can give you advice or tell you where to go for help.  Visit your  dentist. At home, brush your teeth with a soft toothbrush. Floss gently.  Write down your questions. Take them to your prenatal visits. Where to find more information  American Pregnancy Association: americanpregnancy.org  [employer]: www.acog.org  Office on Women's Health: MightyReward.co.nz Contact a doctor if:  You are dizzy.  You have a fever.  You have mild cramps or pressure in your lower belly.  You have a nagging pain in your belly area.  You continue to feel like you may vomit, you vomit, or you have watery poop (diarrhea) for 24 hours or longer.  You have a bad-smelling fluid coming from your vagina.  You have pain when you pee.  You are exposed to a disease that spreads from person to person, such as chickenpox, measles, Zika virus, HIV, or hepatitis. Get help right away if:  You have spotting or bleeding from your vagina.  You have very bad belly cramping or pain.  You have shortness of breath or chest pain.  You have any kind of injury, such as from a fall or a car crash.  You have new or increased pain, swelling, or redness in an arm or leg. Summary  The first trimester of pregnancy starts on the first day of your last menstrual period until the end of week 12 (months 1 through 3).  Eat 4 or 5 small meals a day instead of 3 large meals.  Do not smoke or use any products that contain nicotine or tobacco. If you need help quitting, ask your doctor.  Keep all follow-up visits. This information is not intended to replace advice given to you by your health care provider. Make sure you discuss any questions you have with your health care provider. Document Revised: 07/28/2019 Document Reviewed: 06/03/2019 Elsevier Patient Education  2021 Elsevier Inc.  Morning Sickness  Morning sickness is when you feel like you may vomit (feel nauseous) during pregnancy. Sometimes, you may vomit. Morning sickness most  often happens in the morning, but it can also happen at any time of the day. Some women may have morning sickness that makes them vomit all the time. This is a more serious problem that needs treatment. What are the causes? The cause of this condition is not known. What increases the risk?  You had vomiting or a feeling like you may vomit before your pregnancy.  You had morning sickness in another pregnancy.  You are pregnant with more than one baby, such as twins. What are the signs or symptoms?  Feeling like you may vomit.  Vomiting. How is this treated? Treatment is usually not needed for this condition. You may only need to change what you eat. In some cases, your doctor may give you some things to take for your condition. These include:  Vitamin B6 supplements.  Medicines to treat the feeling that you may vomit.  Ginger. Follow these instructions at home: Medicines  Take over-the-counter and prescription medicines only as told by your doctor. Do not take any medicines until you talk with your doctor about them first.  Take multivitamins before you get pregnant. These can stop or lessen the symptoms of morning sickness. Eating and drinking  Eat dry toast or crackers before getting out of bed.  Eat 5 or 6 small meals a day.  Eat dry and bland foods like rice and baked potatoes.  Do not eat greasy, fatty, or spicy foods.  Have someone cook for you if the smell of food causes you to vomit or to feel like you may vomit.  If you feel like you may vomit after taking prenatal vitamins, take them at night or with a snack.  Eat protein foods when you need a snack. Nuts, yogurt, and cheese are good choices.  Drink fluids throughout the day.  Try ginger ale made with real ginger, ginger tea made from fresh grated ginger, or ginger candies. General instructions  Do not smoke or use any products that contain nicotine or tobacco. If you need help quitting, ask your  doctor.  Use an air purifier to keep the air in your house free of smells.  Get lots of fresh air.  Try to avoid smells that make you feel sick.  Try wearing an acupressure wristband. This is a wristband that is used to treat seasickness.  Try a treatment called acupuncture. In this treatment, a doctor puts needles into certain areas of your body to make you feel better. Contact a doctor if:  You need medicine to feel better.  You feel dizzy or light-headed.  You are losing weight. Get help right away if:  The feeling that you may vomit will not go away, or you cannot stop vomiting.  You faint.  You have very bad pain in your belly. Summary  Morning sickness is when you feel like you may vomit (feel nauseous) during pregnancy.  You may feel sick in the morning, but you can feel this way at any time of the day.  Making some changes to what you eat may help your symptoms go away. This information is not intended to replace advice given to you by your health care provider. Make sure you discuss any questions you have with your health care provider. Document Revised: 10/04/2019 Document Reviewed: 09/13/2019 Elsevier Patient Education  2021 Elsevier Inc.    Intrauterine Device Information An intrauterine device (IUD) is a medical device that is inserted into the uterus to prevent pregnancy. It is a small, T-shaped device that has one or two nylon strings hanging down from it. The strings hang out of the lower part of the uterus (cervix) to allow for future IUD removal. There are two types of IUDs:  Hormone IUD. This type of IUD is made of plastic and contains the hormone progestin (synthetic progesterone). A hormone IUD may last 3-5 years.  Copper IUD. This type of IUD has copper wire wrapped around it. A copper IUD may last up to 10 years. How is an IUD inserted? An IUD is inserted through the vagina, through the cervix, and into the uterus with a minor medical procedure.  The procedure for IUD insertion may vary among health care providers and hospitals. How does an IUD work? Synthetic progesterone in a hormonal IUD prevents pregnancy by:  Thickening cervical mucus to prevent sperm from entering the  uterus.  Thinning the uterine lining to prevent a fertilized egg from being implanted there. Copper in a copper IUD prevents pregnancy by making the uterus and fallopian tubes produce a fluid that kills sperm. What are the advantages of an IUD? Advantages of either type of IUD An IUD:  Is highly effective in preventing pregnancy.  Is reversible. You can become pregnant shortly after the IUD is removed.  Is low-maintenance and can stay in place for a long time.  Has no estrogen-related side effects.  Can be used when breastfeeding.  Is not associated with weight gain.  Can be inserted right after childbirth, an abortion, or a miscarriage. Advantages of a hormone IUD  If it is inserted within 7 days of your period starting, it works right after it has been inserted. If the hormone IUD is inserted at any other time in your cycle, you will need to use a backup method of birth control for 7 days after insertion.  It can make menstrual periods lighter or stop completely.  It can reduce menstrual cramping and other discomforts from menstrual periods.  It can be used for 3-5 years, depending on which IUD you have. Advantages of a copper IUD  It works right after it is inserted.  It can be used as a form of emergency birth control if it is inserted within 5 days after having unprotected sex.  It does not interfere with your body's natural hormones.  It can be used for up to 10 years. What are the disadvantages of an IUD?  An IUD may cause irregular menstrual bleeding for a period of time after insertion.  It is common to have pain during insertion and have cramping and vaginal bleeding after insertion.  An IUD may cut the uterus (uterine  perforation) when it is inserted. This is rare.  Pelvic inflammatory disease (PID) may happen after insertion of an IUD. PID is an infection in the uterus and fallopian tubes. The IUD does not cause the infection. The infection is usually from an unknown sexually transmitted infection (STI). This is rare, and it usually happens during the first 20 days after the IUD is inserted.  A copper IUD can make your menstrual flow heavier and more painful.  IUDs cannot prevent sexually transmitted infections (STIs). How is an IUD removed?   You will lie on your back with your knees bent and your feet in footrests (stirrups).  A device will be inserted into your vagina to spread apart the vaginal walls (speculum). This will allow your health care provider to see the strings attached to the IUD.  Your health care provider will use a small instrument (forceps) to grasp the IUD strings and will pull firmly until the IUD is removed. You may have some discomfort when the IUD is removed. Your health care provider may recommend taking over-the-counter pain relievers, such as ibuprofen, before the procedure. You may also have minor spotting for a few days after the procedure. The procedure for IUD removal may vary among health care providers and hospitals. Is an IUD right for me? If you are interested in an IUD, discuss it with your health care provider. He or she will make sure you are a good candidate for an IUD and will let you know more about the advantages, disadvantage, and possible side effects. This will allow you to make a decision about the device. Summary  An intrauterine device (IUD) is a medical device that is inserted in the uterus to  prevent pregnancy. It is a small, T-shaped device that has one or two nylon strings hanging down from it.  A hormone IUD contains the hormone progestin (synthetic progesterone). A copper IUD has copper wire wrapped around it.  Synthetic progesterone in a hormone IUD  prevents pregnancy by thickening cervical mucus and thinning the walls of the uterus. Copper in a copper IUD prevents pregnancy by making the uterus and fallopian tubes produce a fluid that kills sperm.  A hormone IUD can be left in place for 3-5 years. A copper IUD can be left in place for up to 10 years.  An IUD is inserted and removed by a health care provider. You may feel some pain during insertion and removal. Your health care provider may recommend taking over-the-counter pain medicine, such as ibuprofen, before an IUD procedure. This information is not intended to replace advice given to you by your health care provider. Make sure you discuss any questions you have with your health care provider. Document Revised: 09/01/2019 Document Reviewed: 09/01/2019 Elsevier Patient Education  2021 [employer].

## 2020-04-25 LAB — CBC/D/PLT+RPR+RH+ABO+RUB AB...
Antibody Screen: NEGATIVE
Basophils Absolute: 0 10*3/uL (ref 0.0–0.2)
Basos: 0 %
EOS (ABSOLUTE): 0 10*3/uL (ref 0.0–0.4)
Eos: 0 %
HCV Ab: <0.1 s/co ratio (ref 0.0–0.9)
HIV Screen 4th Generation wRfx: NONREACTIVE
Hematocrit: 36.6 % (ref 34.0–46.6)
Hemoglobin: 12.1 g/dL (ref 11.1–15.9)
Hepatitis B Surface Ag: NEGATIVE
Immature Grans (Abs): 0 10*3/uL (ref 0.0–0.1)
Immature Granulocytes: 0 %
Lymphocytes Absolute: 1.5 10*3/uL (ref 0.7–3.1)
Lymphs: 22 %
MCH: 29.3 pg (ref 26.6–33.0)
MCHC: 33.1 g/dL (ref 31.5–35.7)
MCV: 89 fL (ref 79–97)
Monocytes Absolute: 0.4 10*3/uL (ref 0.1–0.9)
Monocytes: 6 %
Neutrophils Absolute: 5.1 10*3/uL (ref 1.4–7.0)
Neutrophils: 72 %
Platelets: 207 10*3/uL (ref 150–450)
RBC: 4.13 x10E6/uL (ref 3.77–5.28)
RDW: 12.1 % (ref 11.7–15.4)
RPR Ser Ql: NONREACTIVE
Rh Factor: POSITIVE
Rubella Antibodies, IGG: 7.51 index (ref >0.99)
WBC: 7.1 10*3/uL (ref 3.4–10.8)

## 2020-04-25 LAB — HCV INTERPRETATION

## 2020-04-26 LAB — CYTOLOGY - PAP
Chlamydia: NEGATIVE
Comment: NEGATIVE
Comment: NEGATIVE
Comment: NORMAL
Neisseria Gonorrhea: NEGATIVE
Trichomonas: NEGATIVE

## 2020-04-26 LAB — URINE CULTURE, OB REFLEX

## 2020-04-26 LAB — CULTURE, OB URINE

## 2020-04-27 ENCOUNTER — Encounter: Payer: Self-pay | Admitting: Nurse Practitioner

## 2020-04-27 DIAGNOSIS — R87612 Low grade squamous intraepithelial lesion on cytologic smear of cervix (LGSIL): Secondary | ICD-10-CM | POA: Insufficient documentation

## 2020-05-09 ENCOUNTER — Encounter: Payer: Self-pay | Admitting: General Practice

## 2020-05-16 ENCOUNTER — Emergency Department (HOSPITAL_COMMUNITY)
Admission: EM | Admit: 2020-05-16 | Discharge: 2020-05-16 | Disposition: A | Discharge status: Home / Self Care | Payer: Medicaid Other | Attending: Emergency Medicine | Admitting: Emergency Medicine

## 2020-05-16 ENCOUNTER — Telehealth: Payer: Self-pay | Admitting: Lactation Services

## 2020-05-16 ENCOUNTER — Other Ambulatory Visit: Payer: Self-pay

## 2020-05-16 DIAGNOSIS — O2332 Infections of other parts of urinary tract in pregnancy, second trimester: Secondary | ICD-10-CM | POA: Diagnosis not present

## 2020-05-16 DIAGNOSIS — R55 Syncope and collapse: Secondary | ICD-10-CM | POA: Diagnosis not present

## 2020-05-16 DIAGNOSIS — O26892 Other specified pregnancy related conditions, second trimester: Secondary | ICD-10-CM | POA: Insufficient documentation

## 2020-05-16 DIAGNOSIS — Z87891 Personal history of nicotine dependence: Secondary | ICD-10-CM | POA: Insufficient documentation

## 2020-05-16 DIAGNOSIS — Z3A15 15 weeks gestation of pregnancy: Secondary | ICD-10-CM | POA: Diagnosis not present

## 2020-05-16 DIAGNOSIS — N3000 Acute cystitis without hematuria: Secondary | ICD-10-CM

## 2020-05-16 DIAGNOSIS — R110 Nausea: Secondary | ICD-10-CM | POA: Diagnosis not present

## 2020-05-16 LAB — BASIC METABOLIC PANEL
Anion gap: 9 (ref 5–15)
BUN: 8 mg/dL (ref 6–20)
CO2: 24 mmol/L (ref 22–32)
Calcium: 10.1 mg/dL (ref 8.9–10.3)
Chloride: 103 mmol/L (ref 98–111)
Creatinine, Ser: 0.59 mg/dL (ref 0.44–1.00)
GFR, Estimated: >60 mL/min (ref >60)
Glucose, Bld: 90 mg/dL (ref 70–99)
Potassium: 3.8 mmol/L (ref 3.5–5.1)
Sodium: 136 mmol/L (ref 135–145)

## 2020-05-16 LAB — URINALYSIS, ROUTINE W REFLEX MICROSCOPIC
Bilirubin Urine: NEGATIVE
Glucose, UA: NEGATIVE mg/dL
Hgb urine dipstick: NEGATIVE
Ketones, ur: 20 mg/dL — AB
Nitrite: NEGATIVE
Protein, ur: NEGATIVE mg/dL
Specific Gravity, Urine: 1.021 (ref 1.005–1.030)
pH: 7 (ref 5.0–8.0)

## 2020-05-16 LAB — CBC
HCT: 36 % (ref 36.0–46.0)
Hemoglobin: 12.2 g/dL (ref 12.0–15.0)
MCH: 30.2 pg (ref 26.0–34.0)
MCHC: 33.9 g/dL (ref 30.0–36.0)
MCV: 89.1 fL (ref 80.0–100.0)
Platelets: 227 10*3/uL (ref 150–400)
RBC: 4.04 MIL/uL (ref 3.87–5.11)
RDW: 12.9 % (ref 11.5–15.5)
WBC: 7.2 10*3/uL (ref 4.0–10.5)
nRBC: 0 % (ref 0.0–0.2)

## 2020-05-16 MED ORDER — SODIUM CHLORIDE 0.9 % IV BOLUS
1000.0000 mL | Freq: Once | INTRAVENOUS | Status: AC
  Administered 2020-05-16: 1000 mL via INTRAVENOUS

## 2020-05-16 MED ORDER — SODIUM CHLORIDE 0.9 % IV BOLUS: 1000.0000 mL | Freq: Once | INTRAVENOUS | Status: DC

## 2020-05-16 MED ORDER — ONDANSETRON HCL 4 MG/2ML IJ SOLN
4.0000 mg | Freq: Once | INTRAMUSCULAR | Status: AC
  Administered 2020-05-16: 4 mg via INTRAVENOUS
  Filled 2020-05-16: qty 2

## 2020-05-16 MED ORDER — CEPHALEXIN 500 MG PO CAPS: 500.0000 mg | ORAL_CAPSULE | Freq: Two times a day (BID) | ORAL | 0 refills | Status: AC

## 2020-05-16 MED ORDER — SODIUM CHLORIDE 0.9% FLUSH
3.0000 mL | Freq: Once | INTRAVENOUS | Status: AC
  Administered 2020-05-16: 3 mL via INTRAVENOUS

## 2020-05-16 NOTE — ED Provider Notes (Signed)
[facility] EMERGENCY DEPARTMENT Provider Note   CSN: [id number] Arrival date & time: 05/16/20  1609     History Chief Complaint  Patient presents with  . Dizziness    pregnant    WAKISHA ALBERTS is a 25 y.o. female.  HPI 25 year old female [phone] Modena Jansky to the ER with a near syncopal episode.  Patient reports having morning sickness this morning, having nonbloody nonbilious emesis.  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  She did not lose consciousness but felt very close to it.  She denies any prior histories of this in the past.  No history of seizure activity.  Currently feels slightly better in the ER but still nauseous.  Denies any abdominal pain, vaginal bleeding, discharge    Past Medical History:  Diagnosis Date  . Medical history non-contributory     Patient Active Problem List   Diagnosis Date Noted  . 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 Supervision of low-risk first pregnancy, first trimester 04/24/2020  . Arthralgia 04/14/2020    Past Surgical History:  Procedure Laterality Date  . NO PAST SURGERIES       OB History    Gravida  4   Para  2   Term  2   Preterm  0   AB  1   Living  2     SAB  0   IAB  1   Ectopic  0   Multiple  0   Live Births  2           Family History  Problem Relation Age of Onset  . Healthy Mother     Social History   Tobacco Use  . Smoking status: Former Smoker    Packs/day: 0.10    Types: Cigarettes  . Smokeless tobacco: Never Used  Vaping Use  . Vaping Use: Never used  Substance Use Topics  . Alcohol use: Not Currently  . Drug use: Yes    Types: Marijuana    Comment: Last smoked January 2022    Home Medications Prior to Admission medications   Medication Sig Start Date End Date Taking? Authorizing Provider  cephALEXin (KEFLEX) 500 MG capsule Take 1 capsule (500 mg total)  by mouth 2 (two) times daily for 7 days. 05/16/20 05/23/20 Yes Mare Ferrari, PA-C  Doxylamine-Pyridoxine (DICLEGIS) 10-10 MG TBEC Take 2 tablets at bedtime and one in the morning and one in the afternoon as needed for nausea. 04/24/20   Burleson, Brand Males, NP  famotidine (PEPCID) 20 MG tablet Take 1 tablet (20 mg total) by mouth daily. Patient not taking: Reported on 04/24/2020 03/14/20 03/14/21  Nugent, Odie Sera, NP  glycopyrrolate (ROBINUL-FORTE) 2 MG tablet Take 1 tablet (2 mg total) by mouth 3 (three) times daily. 04/24/20   Burleson, Brand Males, NP  metoCLOPramide (REGLAN) 10 MG tablet Take 1 tablet (10 mg total) by mouth 3 (three) times daily with meals. Patient not taking: Reported on 04/24/2020 03/14/20 03/14/21  Nugent, Odie Sera, NP  Prenatal MV & Min w/FA-DHA (PRENATAL GUMMIES PO) Take by mouth.    [provider]  promethazine (PHENERGAN) 25 MG suppository Place 1 suppository (25 mg total) rectally every 6 (six) hours as needed for nausea or vomiting. Patient not taking: Reported on 04/24/2020 03/16/20   Gerrit Heck, CNM    Allergies  Patient has no known allergies.  Review of Systems   Review of Systems  Constitutional: Negative for chills and fever.  HENT: Negative for ear pain and sore throat.   Eyes: Negative for pain and visual disturbance.  Respiratory: Negative for cough and shortness of breath.   Cardiovascular: Negative for chest pain and palpitations.  Gastrointestinal: Positive for nausea. Negative for abdominal pain and vomiting.  Genitourinary: Negative for dysuria and hematuria.  Musculoskeletal: Negative for arthralgias and back pain.  Skin: Negative for color change and rash.  Neurological: Positive for dizziness. Negative for seizures and syncope.  All other systems reviewed and are negative.   Physical Exam Updated Vital Signs BP (!) 114/57 (BP Location: Right Arm)   Pulse 84   Temp 98.8 F (37.1 C) (Oral)   Resp 17   LMP 01/30/2020   SpO2 100%    Physical Exam Vitals and nursing note reviewed.  Constitutional:      General: She is not in acute distress.    Appearance: She is well-developed.  HENT:     Head: Normocephalic and atraumatic.  Eyes:     Conjunctiva/sclera: Conjunctivae normal.  Cardiovascular:     Rate and Rhythm: Normal rate and regular rhythm.     Pulses: Normal pulses.     Heart sounds: No murmur heard.   Pulmonary:     Effort: Pulmonary effort is normal. No respiratory distress.     Breath sounds: Normal breath sounds.  Abdominal:     General: Abdomen is flat.     Palpations: Abdomen is soft.     Tenderness: There is no abdominal tenderness.  Musculoskeletal:        General: Normal range of motion.     Cervical back: Neck supple.  Skin:    General: Skin is warm and dry.  Neurological:     General: No focal deficit present.     Mental Status: She is alert and oriented to person, place, and time.     Comments: Mental Status:  Alert, thought content appropriate, able to give a coherent history. Speech fluent without evidence of aphasia. Able to follow 2 step commands without difficulty.  Cranial Nerves:  II: Peripheral visual fields grossly normal, pupils equal, round, reactive to light III,IV, VI: ptosis not present, extra-ocular motions intact bilaterally  V,VII: smile symmetric, facial light touch sensation equal VIII: hearing grossly normal to voice  X: uvula elevates symmetrically  XI: bilateral shoulder shrug symmetric and strong XII: midline tongue extension without fassiculations Motor:  Normal tone. 5/5 strength of BUE and BLE major muscle groups including strong and equal grip strength and dorsiflexion/plantar flexion Sensory: light touch normal in all extremities. Cerebellar: normal finger-to-nose with bilateral upper extremities, Romberg sign absent Gait: normal gait and balance. Able to walk on toes and heels with ease.    Psychiatric:        Mood and Affect: Mood normal.         Behavior: Behavior normal.     ED Results / Procedures / Treatments   Labs (all labs ordered are listed, but only abnormal results are displayed) Labs Reviewed  URINALYSIS, ROUTINE W REFLEX MICROSCOPIC - Abnormal; Notable for the following components:      Result Value   APPearance HAZY (*)    Ketones, ur 20 (*)    Leukocytes,Ua SMALL (*)    Bacteria, UA FEW (*)    All other components within normal limits  URINE CULTURE  BASIC METABOLIC PANEL  CBC  CBG MONITORING, ED    EKG EKG Interpretation  Date/Time:  Tuesday May 16 2020 16:20:31 EDT Ventricular Rate:  102 PR Interval:  152 QRS Duration: 70 QT Interval:  322 QTC Calculation: 419 R Axis:   79 Text Interpretation: Sinus tachycardia Otherwise normal ECG No sig change from Jul 14 2018 ecg, No STEMI Confirmed by Alvester Chou [phone] on 05/16/2020 4:56:57 PM   Radiology No results found.  Procedures Procedures   Medications Ordered in ED Medications  sodium chloride flush (NS) 0.9 % injection 3 mL (3 mLs Intravenous Given 05/16/20 1924)  sodium chloride 0.9 % bolus 1,000 mL (0 mLs Intravenous Stopped 05/16/20 1838)  ondansetron (ZOFRAN) injection 4 mg (4 mg Intravenous Given 05/16/20 1921)    ED Course  I have reviewed the triage vital signs and the nursing notes.  Pertinent labs & imaging results that were available during my care of the patient were reviewed by me and considered in my medical decision making (see chart for details).    MDM Rules/Calculators/A&P                          25 year old female who presented with a presyncopal episode, reported eye fluttering and anxious appearing.  On arrival, vitals overall reassuring, mildly tachycardic on arrival but this improved throughout the ED course.  Afebrile.  She has no abdominal pain, vaginal bleeding, discharge.  Neuro exam overall reassuring, low suspicion for stroke.  CBC and BMP largely unremarkable, CBG of 90.  EKG normal sinus rhythm, mildly  tachycardic at 102 on arrival.  Patient received 1 L of fluids, Zofran.  On reevaluation, states that she feels much better.  Orthostatic vitals negative.  UA with small leukocytes and few bacteria, will culture and treat for UTI.  Patient encouraged to follow-up with OB/GYN.  Suspect her symptoms are secondary due to dehydration.  Patient encouraged to drink plenty of fluids.  Return precautions discussed.  She voiced understanding is agreeable. Final Clinical Impression(s) / ED Diagnoses Final diagnoses:  Near syncope  Acute cystitis without hematuria    Rx / DC Orders ED Discharge Orders         Ordered    cephALEXin (KEFLEX) 500 MG capsule  2 times daily        05/16/20 2038           Leone Brand 05/16/20 2051    Terald Sleeper, MD 05/17/20 [phone]

## 2020-05-16 NOTE — ED Triage Notes (Signed)
Emergency Medicine Provider Triage Evaluation Note  Hannah Ponce , a 25 y.o. female  was evaluated in triage.  Pt complains of feeling shaky. Onset 3PM today when she got to work, felt like she might pass out, lost hearing in both ears, rested in break room and then upon returning to the floor to work, felt like she might pass out. No abdominal pain, no vaginal bleeding. [redacted] weeks pregnant.   Review of Systems  Positive: Light headed/dizzy Negative: Abdominal pain  Physical Exam  LMP 01/30/2020  Gen:   Awake, no distress , anxious HEENT:  Atraumatic, EOMI Resp:  Normal effort  Cardiac:  Normal rate  Abd:   Nondistended, nontender  MSK:   Moves extremities without difficulty, no weakness, no pronator drift Neuro:  Speech clear  Medical Decision Making  Medically screening exam initiated at 4:17 PM.  Appropriate orders placed.  JOANMARIE TSANG was informed that the remainder of the evaluation will be completed by another provider, this initial triage assessment does not replace that evaluation, and the importance of remaining in the ED until their evaluation is complete.  Clinical Impression  Further work up in the ER pending.    Jeannie Fend, PA-C 05/16/20 1620

## 2020-05-16 NOTE — ED Notes (Signed)
Eulah Pont, PA at triage for [facility].

## 2020-05-16 NOTE — Telephone Encounter (Signed)
Called patient at request of Prenatal Navigator, Renea Ee.   Mom reports she would like to BF her child. Patient reports she had difficulty BF her last babies. They were both under 6 pounds. She had difficulty latching the infants.   She was pumping and she kept getting plugged ducts. She reports she had a Medela pump.   Reviewed early skin to skin and early pumping if infant will not latch. Reviewed sometimes the smaller babies have a harder time due to stamina.   Patient will ask for Lactation when in for visits and is aware she can make appt with lactation after infant is born to assess latch and milk production. Patient with no specific questions at this time. Will call as needed.

## 2020-05-16 NOTE — ED Triage Notes (Addendum)
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  2W RN reports pt came to nurses station while working and was shaking and stuttering.  States she felt lightheaded and SOB.  Pt states she is [redacted] weeks pregnant.  No arm drift. Pt very anxious.  Eyelids fluttering.  Got to work at [employer] and states she felt fine all day other than morning sickness/ vomited x 2.  States after getting to work she felt syncopal and went to the break room and sat down.  States she lost hearing in bilateral ears.  Felt better after sitting but felt syncopal again when she stood up.

## 2020-05-16 NOTE — Discharge Instructions (Signed)
Your work-up today was overall reassuring.  Please make sure to drink plenty of fluids.  Your urine did show possible signs of a UTI, we will send an antibiotic, please take this until finished.  Please make sure to follow-up with your OB/GYN.  Return to the ER for any new or worsening symptoms

## 2020-05-18 LAB — URINE CULTURE: Culture: >=100000 — AB

## 2020-05-22 ENCOUNTER — Ambulatory Visit (INDEPENDENT_AMBULATORY_CARE_PROVIDER_SITE_OTHER): Payer: Medicaid Other | Admitting: Nurse Practitioner

## 2020-05-22 ENCOUNTER — Other Ambulatory Visit: Payer: Self-pay

## 2020-05-22 VITALS — BP 115/69 | HR 96 | Wt 108.9 lb

## 2020-05-22 DIAGNOSIS — Z3401 Encounter for supervision of normal first pregnancy, first trimester: Secondary | ICD-10-CM

## 2020-05-22 DIAGNOSIS — Z3A16 16 weeks gestation of pregnancy: Secondary | ICD-10-CM

## 2020-05-22 DIAGNOSIS — R87612 Low grade squamous intraepithelial lesion on cytologic smear of cervix (LGSIL): Secondary | ICD-10-CM

## 2020-05-22 NOTE — Progress Notes (Signed)
    Subjective:  Hannah Ponce is a 25 y.o. [phone] at [redacted]w[redacted]d being seen today for ongoing prenatal care.  She is currently monitored for the following issues for this low-risk pregnancy and has Arthralgia; Supervision of low-risk first pregnancy, first trimester; and LGSIL on Pap smear of cervix on their problem list.  Patient reports near syncopal episode while at work and went to ER.  Contractions: Not present. Vag. Bleeding: None.  Movement: Present. Denies leaking of fluid.   The following portions of the patient's history were reviewed and updated as appropriate: allergies, current medications, past family history, past medical history, past social history, past surgical history and problem list. Problem list updated.  Objective:   Vitals:   05/22/20 1550  BP: 115/69  Pulse: 96  Weight: 108 lb 14.4 oz (49.4 kg)    Fetal Status: Fetal Heart Rate (bpm): 146 Fundal Height: 17 cm Movement: Present     General:  Alert, oriented and cooperative. Patient is in no acute distress.  Skin: Skin is warm and dry. No rash noted.   Cardiovascular: Normal heart rate noted  Respiratory: Normal respiratory effort, no problems with respiration noted  Abdomen: Soft, gravid, appropriate for gestational age. Pain/Pressure: Absent     Pelvic:  Cervical exam deferred        Extremities: Normal range of motion.  Edema: None  Mental Status: Normal mood and affect. Normal behavior. Normal judgment and thought content.   Urinalysis:      Assessment and Plan:  Pregnancy: [id number] at [redacted]w[redacted]d  1. Supervision of low-risk first pregnancy, first trimester Missed talking with client about her pap smear today.  Recommend repeat pap pp. Discussed ER visit (works at [employer] and was taken to ER by coworkers).  Reviewed signs of syncope and reviewed lying down if these occur. Has not had additional syncopal episodes since ER visit. Taking Macrobid for UTI diagnosed in ER Has BP cuff at home and advised she will need  to take BP at virtual visits.  - AFP, Serum, Open Spina Bifida   Preterm labor symptoms and general obstetric precautions including but not limited to vaginal bleeding, contractions, leaking of fluid and fetal movement were reviewed in detail with the patient. Please refer to After Visit Summary for other counseling recommendations.  Return in about 4 weeks (around 06/19/2020) for virtual ROB.  Nolene Bernheim, RN, MSN, NP-BC Nurse Practitioner, [facility] for [employer], [facility] 05/22/2020 4:09 PM

## 2020-05-22 NOTE — Patient Instructions (Signed)

## 2020-05-31 ENCOUNTER — Telehealth: Payer: Self-pay | Admitting: Lactation Services

## 2020-05-31 NOTE — Telephone Encounter (Signed)
Opened in error

## 2020-06-05 LAB — AFP, SERUM, OPEN SPINA BIFIDA
AFP MoM: 1.09
AFP Value: 51.3 ng/mL
Gest. Age on Collection Date: 16.1 weeks
Maternal Age At EDD: 26.7 yr
OSBR Risk 1 IN: 10000
Test Results:: NEGATIVE
Weight: 109 [lb_av]

## 2020-06-12 ENCOUNTER — Other Ambulatory Visit: Payer: Self-pay

## 2020-06-12 ENCOUNTER — Ambulatory Visit: Payer: Medicaid Other | Attending: Nurse Practitioner

## 2020-06-12 DIAGNOSIS — Z3401 Encounter for supervision of normal first pregnancy, first trimester: Secondary | ICD-10-CM | POA: Insufficient documentation

## 2020-06-19 ENCOUNTER — Telehealth: Payer: Medicaid Other | Admitting: Student

## 2020-06-19 DIAGNOSIS — Z3401 Encounter for supervision of normal first pregnancy, first trimester: Secondary | ICD-10-CM

## 2020-06-19 NOTE — Progress Notes (Signed)
[id number]: Attempted to connect with patient; she was not online. Will send message to office staff to reschedule.   Hannah Ponce

## 2020-06-23 ENCOUNTER — Encounter (HOSPITAL_COMMUNITY): Payer: Self-pay

## 2020-07-12 ENCOUNTER — Ambulatory Visit (INDEPENDENT_AMBULATORY_CARE_PROVIDER_SITE_OTHER): Payer: Medicaid Other | Admitting: Certified Nurse Midwife

## 2020-07-12 ENCOUNTER — Other Ambulatory Visit: Payer: Self-pay

## 2020-07-12 VITALS — BP 124/76 | HR 99 | Wt 115.2 lb

## 2020-07-12 DIAGNOSIS — Z3492 Encounter for supervision of normal pregnancy, unspecified, second trimester: Secondary | ICD-10-CM

## 2020-07-12 DIAGNOSIS — O26812 Pregnancy related exhaustion and fatigue, second trimester: Secondary | ICD-10-CM

## 2020-07-12 DIAGNOSIS — Z3A23 23 weeks gestation of pregnancy: Secondary | ICD-10-CM

## 2020-07-12 MED ORDER — B COMPLEX-C PO TABS: 1.0000 | ORAL_TABLET | Freq: Every day | ORAL | 2 refills | Status: DC

## 2020-07-12 NOTE — Patient Instructions (Signed)
 Glucose Tolerance Test Why am I having this test? The glucose tolerance test (GTT) is done to check how your body processes sugar (glucose). This is one of several tests used to diagnose diabetes (diabetes mellitus). Your health care provider may recommend this test if you:  Have a family history of diabetes.  Are obese.  Have infections that keep coming back.  Have had a lot of wounds that did not heal quickly, especially on your legs and feet.  Are a woman and have a history of giving birth to very large babies or a history of repeated fetal loss (stillbirth).  Have had high glucose levels in your urine or blood: ? During a past pregnancy. ? After a heart attack, surgery, or prolonged periods of high stress. What is being tested? This test measures the amount of glucose in your blood at different times during a period of 2 hours. This indicates how well your body is able to process glucose. What kind of sample is taken? Blood samples are required for this test. They are usually collected by inserting a needle into a blood vessel.   How do I prepare for this test?  For 3 days before your test, eat normally. Have plenty of carbohydrate-rich foods.  Follow instructions from your health care provider about: ? Eating or drinking restrictions on the day of the test. You may be asked to not eat or drink anything other than water (fast) starting 8-12 hours before the test. ? Changing or stopping your regular medicines. Some medicines may interfere with this test. Tell a health care provider about:  All medicines you are taking, including vitamins, herbs, eye drops, creams, and over-the-counter medicines.  Any blood disorders you have.  Any surgeries you have had.  Any medical conditions you have.  Whether you are pregnant or may be pregnant. What happens during the test? First, your blood glucose will be measured. This is referred to as your fasting blood glucose, since you  fasted before the test. Then, you will drink a glucose solution that contains a specific amount of glucose. Your blood glucose will be measured again 1 and 2 hours after drinking the solution. This test takes 2 hours to complete. You will need to stay at the testing location during this time. During the testing period:  Do not eat or drink anything other than the glucose solution. You will be allowed to drink water.  Do not exercise.  Do not use any products that contain nicotine or tobacco. These products include cigarettes, chewing tobacco, and vaping devices, such as e-cigarettes. If you need help quitting, ask your health care provider. The testing procedure may vary among health care providers and hospitals. How are the results reported? Your test results will be reported as values. These will be given as milligrams of glucose per deciliter of blood (mg/dL) or millimoles per liter (mmol/L). Your health care provider will compare your results to normal ranges that were established after testing a large group of people (reference ranges). Reference ranges may vary among labs and hospitals. For this test, common reference ranges are:  Fasting: less than 110 mg/dL (6.1 mmol/L).  1 hour after drinking glucose: less than 180 mg/dL (10.0 mmol/L).  2 hours after drinking glucose: less than 140 mg/dL (7.8 mmol/L). What do the results mean? Results that are within the reference ranges are considered normal, meaning that your glucose levels are well controlled. Results higher than the reference ranges may mean that you recently experienced   stress, such as from an injury or a sudden (acute) condition like a heart attack or stroke, or that you have:  Diabetes.  Cushing syndrome.  Tumors such as pheochromocytoma or glucagonoma.  Kidney failure.  Pancreatitis.  Hyperthyroidism.  An infection. Talk with your health care provider about what your results mean. Questions to ask your health care  provider Ask your health care provider, or the department that is doing the test:  When will my results be ready?  How will I get my results?  What are my treatment options?  What other tests do I need?  What are my next steps? Summary  The GTT is done to check how your body processes glucose. This is one of several tests used to diagnose diabetes.  This test measures the amount of glucose in your blood at different times during a period of 2 hours. This indicates how well your body is able to process glucose.  Talk with your health care provider about what your results mean. This information is not intended to replace advice given to you by your health care provider. Make sure you discuss any questions you have with your health care provider. Document Revised: 11/17/2019 Document Reviewed: 11/17/2019 Elsevier Patient Education  2021 Elsevier Inc.  

## 2020-07-12 NOTE — Progress Notes (Signed)
Pressure on pelvis when she walks for long period of time but denies any pain or vaginal bleeding.

## 2020-07-12 NOTE — Progress Notes (Signed)
   PRENATAL VISIT NOTE  Subjective:  Hannah Ponce is a 25 y.o. [phone] at [redacted]w[redacted]d being seen today for ongoing prenatal care.  She is currently monitored for the following issues for this low-risk pregnancy and has Arthralgia; Supervision of low-risk first pregnancy, first trimester; and LGSIL on Pap smear of cervix on their problem list.  Patient reports fatigue.  Contractions: Not present. Vag. Bleeding: None.  Movement: Present. Denies leaking of fluid.   The following portions of the patient's history were reviewed and updated as appropriate: allergies, current medications, past family history, past medical history, past social history, past surgical history and problem list.   Objective:   Vitals:   07/12/20 1440  BP: 124/76  Pulse: 99  Weight: 115 lb 3.2 oz (52.3 kg)    Fetal Status: Fetal Heart Rate (bpm): 150 Fundal Height: 24 cm Movement: Present     General:  Alert, oriented and cooperative. Patient is in no acute distress.  Skin: Skin is warm and dry. No rash noted.   Cardiovascular: Normal heart rate noted  Respiratory: Normal respiratory effort, no problems with respiration noted  Abdomen: Soft, gravid, appropriate for gestational age.  Pain/Pressure: Present     Pelvic: Cervical exam deferred        Extremities: Normal range of motion.  Edema: None  Mental Status: Normal mood and affect. Normal behavior. Normal judgment and thought content.   Assessment and Plan:  Pregnancy: [id number] at [redacted]w[redacted]d 1. Supervision of low-risk pregnancy, second trimester - Doing alright, fatigued but feeling regular fetal movement  2. [redacted] weeks gestation of pregnancy - Routine OB care - Missed last virtual visit because she works nights and fell asleep waiting for provider to join the call. Will keep visits in-person from now on. - Anticipatory guidance given re GTT at next visit  3. Fatigue in pregnancy, second trimester - last CBC well WNL - was very sick in beginning of pregnancy,  discussed importance of increased nutrition and hydration to help her body recover from a prolonged period of time of being malnourished. Pt verbalized understanding. - B Complex-C (B-COMPLEX WITH VITAMIN C) tablet; Take 1 tablet by mouth daily. Take with food  Dispense: 30 tablet; Refill: 2  Preterm labor symptoms and general obstetric precautions including but not limited to vaginal bleeding, contractions, leaking of fluid and fetal movement were reviewed in detail with the patient. Please refer to After Visit Summary for other counseling recommendations.   Return in about 5 weeks (around 08/16/2020) for IN-PERSON, LOB w GTT.  Future Appointments  Date Time Provider Department Center  08/18/2020 10:15 AM Kathlene Cote [facility]    Bernerd Limbo, CNM

## 2020-08-04 ENCOUNTER — Telehealth: Payer: Medicaid Other | Admitting: Family

## 2020-08-04 ENCOUNTER — Encounter: Payer: Self-pay | Admitting: Family

## 2020-08-04 DIAGNOSIS — Z3A26 26 weeks gestation of pregnancy: Secondary | ICD-10-CM

## 2020-08-04 DIAGNOSIS — R0602 Shortness of breath: Secondary | ICD-10-CM | POA: Diagnosis not present

## 2020-08-04 NOTE — Progress Notes (Signed)
   Virtual Visit  Note Due to COVID-19 pandemic this visit was conducted virtually. This visit type was conducted due to national recommendations for restrictions regarding the COVID-19 Pandemic (e.g. social distancing, sheltering in place) in an effort to limit this patient's exposure and mitigate transmission in our community. All issues noted in this document were discussed and addressed.  A physical exam was not performed with this format.  I connected with Jamelle Rushing on 08/04/20 at 9:00 AM  by video and verified that I am speaking with the correct person using two identifiers. Hannah Ponce is currently located at home and no one is currently with her during visit. The provider, Jannifer Rodney, FNP is located in their office at time of visit.  I discussed the limitations, risks, security and privacy concerns of performing an evaluation and management service by video and the availability of in person appointments. I also discussed with the patient that there may be a patient responsible charge related to this service. The patient expressed understanding and agreed to proceed.   History and Present Illness:  Pt calls the office today with complaints of SOB. She is [redacted] weeks pregnant. She reports this has been on going for the last week. She reports she feels baby moving. Denies any fever. Had COVID several weeks ago, but those symptoms resolved. She has an appointment with GYN on 08/18/20. Shortness of Breath This is a new problem. The current episode started 1 to 4 weeks ago. The problem occurs constantly. The problem has been unchanged. Associated symptoms include vomiting. Pertinent negatives include no chest pain, fever, headaches, leg pain, leg swelling or sore throat. She has tried rest for the symptoms. The treatment provided no relief.     Review of Systems  Constitutional: Negative for fever.  HENT: Negative for sore throat.   Respiratory: Positive for shortness of breath.    Cardiovascular: Negative for chest pain and leg swelling.  Gastrointestinal: Positive for vomiting.  Neurological: Negative for headaches.     Observations/Objective: No active SOB noted, but patient sitting. Pregnant.   Assessment and Plan: 1. SOB (shortness of breath)  2. [redacted] weeks gestation of pregnancy  Pt advised to call GYN today and be seen. Need to rule out causes.  If unable to be seen will need to go to ED.     I discussed the assessment and treatment plan with the patient. The patient was provided an opportunity to ask questions and all were answered. The patient agreed with the plan and demonstrated an understanding of the instructions.   The patient was advised to call back or seek an in-person evaluation if the symptoms worsen or if the condition fails to improve as anticipated.  The above assessment and management plan was discussed with the patient. The patient verbalized understanding of and has agreed to the management plan. Patient is aware to call the clinic if symptoms persist or worsen. Patient is aware when to return to the clinic for a follow-up visit. Patient educated on when it is appropriate to go to the emergency department.   Time call ended: 9:09 AM    I provided 9 minutes of   face-to-face time during this encounter.    Jannifer Rodney, FNP

## 2020-08-15 ENCOUNTER — Ambulatory Visit (INDEPENDENT_AMBULATORY_CARE_PROVIDER_SITE_OTHER): Payer: Medicaid Other

## 2020-08-15 ENCOUNTER — Other Ambulatory Visit: Payer: Self-pay

## 2020-08-15 VITALS — BP 113/75 | HR 101 | Wt 120.1 lb

## 2020-08-15 DIAGNOSIS — Z23 Encounter for immunization: Secondary | ICD-10-CM | POA: Diagnosis not present

## 2020-08-15 DIAGNOSIS — Z3A28 28 weeks gestation of pregnancy: Secondary | ICD-10-CM

## 2020-08-15 DIAGNOSIS — Z3401 Encounter for supervision of normal first pregnancy, first trimester: Secondary | ICD-10-CM

## 2020-08-15 DIAGNOSIS — R0602 Shortness of breath: Secondary | ICD-10-CM

## 2020-08-15 DIAGNOSIS — Z1331 Encounter for screening for depression: Secondary | ICD-10-CM

## 2020-08-15 NOTE — Progress Notes (Signed)
   PRENATAL VISIT NOTE  Subjective:  Hannah Ponce is a 25 y.o. [phone] at [redacted]w[redacted]d being seen today for ongoing prenatal care. She is currently monitored for the following issues for this low-risk pregnancy and has Arthralgia; Supervision of low-risk first pregnancy, first trimester; and LGSIL on Pap smear of cervix on their problem list.  Patient reports ongoing shortness of breath for the past 2 weeks only with movement. She is concerned that it may be related to when she was diagnosed with Covid back in January. She reports an ongoing cough as well that has been lingering since Covid. She denies SOB when at rest.  Contractions: Not present. Vag. Bleeding: None.  Movement: Present. Denies leaking of fluid.   The following portions of the patient's history were reviewed and updated as appropriate: allergies, current medications, past family history, past medical history, past social history, past surgical history and problem list.   Objective:   Vitals:   08/15/20 0916  BP: 113/75  Pulse: (!) 101  Weight: 120 lb 1.6 oz (54.5 kg)    Fetal Status: Fetal Heart Rate (bpm): 135 Fundal Height: 27 cm Movement: Present     General:  Alert, oriented and cooperative. Patient is in no acute distress.  Skin: Skin is warm and dry. No rash noted.   Cardiovascular: Normal heart rate and rhythm  Respiratory: Normal respiratory effort, breath sounds clear bilaterally  Abdomen: Soft, gravid, appropriate for gestational age.        Pelvic: Cervical exam deferred        Extremities: Normal range of motion.     Mental Status: Normal mood and affect. Normal behavior. Normal judgment and thought content.   Assessment and Plan:  Pregnancy: [id number] at [redacted]w[redacted]d 1. Supervision of low-risk first pregnancy, first trimester - Routine OB care - Anticipatory guidance for upcoming appointments provided  - Tdap vaccine greater than or equal to 7yo IM - CBC - Glucose Tolerance, 2 Hours w/1 Hour - HIV Antibody  (routine testing w rflx) - RPR  2. Positive depression screening - Denies SI/HI - Ambulatory referral to Integrated Behavioral Health  3. [redacted] weeks gestation of pregnancy  - DG Chest 2 View; Future  4. Shortness of breath - Ongoing SOB with activity, pt concerned r/t to Covid diagnosis in January - Lung sounds clear bilaterally, VSS - Likely related to pregnancy but will order OP chest x-ray - DG Chest 2 View; Future   Preterm labor symptoms and general obstetric precautions including but not limited to vaginal bleeding, contractions, leaking of fluid and fetal movement were reviewed in detail with the patient. Please refer to After Visit Summary for other counseling recommendations.   Return in about 2 weeks (around 08/29/2020) for ROB.   Future Appointments  Date Time Provider Department Center  08/29/2020  1:15 PM Brand Males, CNM [facility]    Brand Males, CNM 08/15/20 12:18 PM

## 2020-08-16 ENCOUNTER — Telehealth: Payer: Self-pay | Admitting: Lactation Services

## 2020-08-16 LAB — RPR: RPR Ser Ql: NONREACTIVE

## 2020-08-16 LAB — HIV ANTIBODY (ROUTINE TESTING W REFLEX): HIV Screen 4th Generation wRfx: NONREACTIVE

## 2020-08-16 LAB — CBC
Hematocrit: 33.2 % — ABNORMAL LOW (ref 34.0–46.6)
Hemoglobin: 10.7 g/dL — ABNORMAL LOW (ref 11.1–15.9)
MCH: 29.1 pg (ref 26.6–33.0)
MCHC: 32.2 g/dL (ref 31.5–35.7)
MCV: 90 fL (ref 79–97)
Platelets: 204 10*3/uL (ref 150–450)
RBC: 3.68 x10E6/uL — ABNORMAL LOW (ref 3.77–5.28)
RDW: 12.1 % (ref 11.7–15.4)
WBC: 6.8 10*3/uL (ref 3.4–10.8)

## 2020-08-16 LAB — GLUCOSE TOLERANCE, 2 HOURS W/ 1HR
Glucose, 1 hour: 71 mg/dL (ref 65–179)
Glucose, 2 hour: 61 mg/dL — ABNORMAL LOW (ref 65–152)
Glucose, Fasting: 63 mg/dL — ABNORMAL LOW (ref 65–91)

## 2020-08-16 MED ORDER — FERROUS SULFATE 325 (65 FE) MG PO TABS: 325.0000 mg | ORAL_TABLET | Freq: Every day | ORAL | 1 refills | Status: DC

## 2020-08-16 NOTE — Telephone Encounter (Signed)
Called patient to inform her 2 hour gtt was normal and to inform her Iron supplementation has been sent to her Pharmacy to take daily with OJ.   Patient did not answer. LM for patient to call the office with results and recommendations. Will send My Chart message.

## 2020-08-16 NOTE — Telephone Encounter (Signed)
-----   Message from Brand Males, CNM sent at 08/16/2020 11:12 AM EDT ----- Patient passed glucola. Can you please send in PO iron supplement as patient is anemic. Thanks!  -Duwayne Heck

## 2020-08-18 ENCOUNTER — Encounter: Payer: Medicaid Other | Admitting: Medical

## 2020-08-29 ENCOUNTER — Ambulatory Visit (INDEPENDENT_AMBULATORY_CARE_PROVIDER_SITE_OTHER): Payer: Medicaid Other

## 2020-08-29 ENCOUNTER — Encounter (HOSPITAL_COMMUNITY): Payer: Self-pay | Admitting: Obstetrics & Gynecology

## 2020-08-29 ENCOUNTER — Other Ambulatory Visit: Payer: Self-pay

## 2020-08-29 ENCOUNTER — Inpatient Hospital Stay (HOSPITAL_COMMUNITY): Payer: Medicaid Other

## 2020-08-29 ENCOUNTER — Inpatient Hospital Stay (HOSPITAL_COMMUNITY)
Admission: AD | Admit: 2020-08-29 | Discharge: 2020-08-29 | Disposition: A | Discharge status: Home / Self Care | Payer: Medicaid Other | Attending: Obstetrics & Gynecology | Admitting: Obstetrics & Gynecology

## 2020-08-29 VITALS — BP 130/78 | HR 123 | Temp 98.3°F | Wt 120.3 lb

## 2020-08-29 DIAGNOSIS — R002 Palpitations: Secondary | ICD-10-CM

## 2020-08-29 DIAGNOSIS — R0602 Shortness of breath: Secondary | ICD-10-CM | POA: Diagnosis not present

## 2020-08-29 DIAGNOSIS — O26893 Other specified pregnancy related conditions, third trimester: Secondary | ICD-10-CM | POA: Diagnosis not present

## 2020-08-29 DIAGNOSIS — Z20822 Contact with and (suspected) exposure to covid-19: Secondary | ICD-10-CM | POA: Diagnosis not present

## 2020-08-29 DIAGNOSIS — Z87891 Personal history of nicotine dependence: Secondary | ICD-10-CM | POA: Diagnosis not present

## 2020-08-29 DIAGNOSIS — Z3A30 30 weeks gestation of pregnancy: Secondary | ICD-10-CM | POA: Diagnosis not present

## 2020-08-29 DIAGNOSIS — Z3401 Encounter for supervision of normal first pregnancy, first trimester: Secondary | ICD-10-CM

## 2020-08-29 DIAGNOSIS — O99891 Other specified diseases and conditions complicating pregnancy: Secondary | ICD-10-CM | POA: Diagnosis not present

## 2020-08-29 LAB — CBC WITH DIFFERENTIAL/PLATELET
Abs Immature Granulocytes: 0.04 10*3/uL (ref 0.00–0.07)
Basophils Absolute: 0 10*3/uL (ref 0.0–0.1)
Basophils Relative: 0 %
Eosinophils Absolute: 0 10*3/uL (ref 0.0–0.5)
Eosinophils Relative: 0 %
HCT: 34.3 % — ABNORMAL LOW (ref 36.0–46.0)
Hemoglobin: 11.3 g/dL — ABNORMAL LOW (ref 12.0–15.0)
Immature Granulocytes: 0 %
Lymphocytes Relative: 19 %
Lymphs Abs: 1.8 10*3/uL (ref 0.7–4.0)
MCH: 29.4 pg (ref 26.0–34.0)
MCHC: 32.9 g/dL (ref 30.0–36.0)
MCV: 89.3 fL (ref 80.0–100.0)
Monocytes Absolute: 0.7 10*3/uL (ref 0.1–1.0)
Monocytes Relative: 7 %
Neutro Abs: 6.9 10*3/uL (ref 1.7–7.7)
Neutrophils Relative %: 74 %
Platelets: 248 10*3/uL (ref 150–400)
RBC: 3.84 MIL/uL — ABNORMAL LOW (ref 3.87–5.11)
RDW: 12.8 % (ref 11.5–15.5)
WBC: 9.5 10*3/uL (ref 4.0–10.5)
nRBC: 0 % (ref 0.0–0.2)

## 2020-08-29 LAB — RESP PANEL BY RT-PCR (FLU A&B, COVID) ARPGX2
Influenza A by PCR: NEGATIVE
Influenza B by PCR: NEGATIVE
SARS Coronavirus 2 by RT PCR: NEGATIVE

## 2020-08-29 LAB — COMPREHENSIVE METABOLIC PANEL
ALT: 21 U/L (ref 0–44)
AST: 30 U/L (ref 15–41)
Albumin: 3 g/dL — ABNORMAL LOW (ref 3.5–5.0)
Alkaline Phosphatase: 65 U/L (ref 38–126)
Anion gap: 9 (ref 5–15)
BUN: 9 mg/dL (ref 6–20)
CO2: 21 mmol/L — ABNORMAL LOW (ref 22–32)
Calcium: 8.7 mg/dL — ABNORMAL LOW (ref 8.9–10.3)
Chloride: 101 mmol/L (ref 98–111)
Creatinine, Ser: 0.62 mg/dL (ref 0.44–1.00)
GFR, Estimated: >60 mL/min (ref >60)
Glucose, Bld: 65 mg/dL — ABNORMAL LOW (ref 70–99)
Potassium: 3.7 mmol/L (ref 3.5–5.1)
Sodium: 131 mmol/L — ABNORMAL LOW (ref 135–145)
Total Bilirubin: 0.5 mg/dL (ref 0.3–1.2)
Total Protein: 6.8 g/dL (ref 6.5–8.1)

## 2020-08-29 LAB — TROPONIN I (HIGH SENSITIVITY): Troponin I (High Sensitivity): 5 ng/L (ref <18)

## 2020-08-29 MED ORDER — IOHEXOL 350 MG/ML SOLN
50.0000 mL | Freq: Once | INTRAVENOUS | Status: AC | PRN
  Administered 2020-08-29: 50 mL via INTRAVENOUS

## 2020-08-29 NOTE — Progress Notes (Signed)
Patient complains of braxton hicks. She said it started yesturday but has not had any today. Patient also stated that she has been vomiting a lot along with constant shortness of breath  Wednesday Ericsson, CMA  08/24/20

## 2020-08-29 NOTE — MAU Note (Signed)
Hannah Ponce is a 25 y.o. at [redacted]w[redacted]d here in MAU reporting: SOB ongoing since the 2nd trimester started. Sometimes feels like her heart is racing. No pain, bleeding, or LOF.  Onset of complaint: ongoing  Pain score: 0/10  Vitals:   08/29/20 1515  BP: 122/79  Pulse: (!) 130  Resp: 20  Temp: 98.2 F (36.8 C)  SpO2: 100%     FHT:EFM applied in room  Lab orders placed from triage: UA

## 2020-08-29 NOTE — MAU Note (Signed)
CT aware of patient. Stated they would retrieve patient as soon as they could.

## 2020-08-29 NOTE — MAU Provider Note (Addendum)
History     CSN: [id number]  Arrival date and time: 08/29/20 1437   Event Date/Time   First Provider Initiated Contact with Patient 08/29/20 1547      Chief Complaint  Patient presents with   Shortness of Breath   HPI Hannah Ponce is a 25 y.o. [id number] at [redacted]w[redacted]d who presents from the office for evaluation of shortness of breath. Symptoms have been ongoing since the beginning of her second trimester but worsened significantly in the last week. Symptoms worse with movement and talking. States she is unable to lie flat. Has had a "slight" clear mucoid producing cough. Denies fever/chills, chest pain, palpitations, abdominal pain, LOF, vaginal bleeding, or calf pain. Does not smoke. Denies hx of asthma. Had covid in January of this year.  Reports good fetal movement.   OB History     Gravida  4   Para  2   Term  2   Preterm  0   AB  1   Living  2      SAB  0   IAB  1   Ectopic  0   Multiple  0   Live Births  2           Past Medical History:  Diagnosis Date   Medical history non-contributory     Past Surgical History:  Procedure Laterality Date   NO PAST SURGERIES      Family History  Problem Relation Age of Onset   Healthy Mother     Social History   Tobacco Use   Smoking status: Former    Packs/day: 0.00    Pack years: 0.00    Types: Cigarettes   Smokeless tobacco: Never  Vaping Use   Vaping Use: Never used  Substance Use Topics   Alcohol use: Not Currently   Drug use: Not Currently    Allergies:  Allergies  Allergen Reactions   Avocado Itching and Rash    Medications Prior to Admission  Medication Sig Dispense Refill Last Dose   B Complex-C (B-COMPLEX WITH VITAMIN C) tablet Take 1 tablet by mouth daily. Take with food 30 tablet 2    ferrous sulfate 325 (65 FE) MG tablet Take 1 tablet (325 mg total) by mouth daily with breakfast. 90 tablet 1    Prenatal MV & Min w/FA-DHA (PRENATAL GUMMIES PO) Take by mouth.       Review of  Systems  Constitutional: Negative.   Respiratory:  Positive for cough and shortness of breath. Negative for wheezing.   Cardiovascular:  Negative for chest pain, palpitations and leg swelling.  Gastrointestinal: Negative.   Genitourinary: Negative.   Physical Exam   Blood pressure 118/68, pulse (!) 102, temperature 98.2 F (36.8 C), temperature source Oral, resp. rate 20, last menstrual period 01/30/2020, SpO2 100 %.  Physical Exam Vitals reviewed.  Constitutional:      General: She is not in acute distress.    Appearance: She is well-developed.  HENT:     Head: Normocephalic and atraumatic.  Cardiovascular:     Rate and Rhythm: Tachycardia present. Rhythm irregular.  Pulmonary:     Breath sounds: Normal breath sounds. No wheezing.     Comments: Pt does appear to be having trouble taking breaths but not using accessory muscles & able to speak clearly.  Chest:     Chest wall: No edema.  Skin:    General: Skin is warm and dry.  Neurological:     Mental Status: She is  alert.  Psychiatric:        Mood and Affect: Mood normal.   NST:  Baseline: 145 bpm, Variability: Good {> 6 bpm), Accelerations: Reactive, and Decelerations: Absent  MAU Course  Procedures Results for orders placed or performed during the hospital encounter of 08/29/20 (from the past 24 hour(s))  CBC with Differential/Platelet     Status: Abnormal   Collection Time: 08/29/20  4:05 PM  Result Value Ref Range   WBC 9.5 4.0 - 10.5 K/uL   RBC 3.84 (L) 3.87 - 5.11 MIL/uL   Hemoglobin 11.3 (L) 12.0 - 15.0 g/dL   HCT 34.3 (L) 56.8 - 61.6 %   MCV 89.3 80.0 - 100.0 fL   MCH 29.4 26.0 - 34.0 pg   MCHC 32.9 30.0 - 36.0 g/dL   RDW 83.7 29.0 - 21.1 %   Platelets 248 150 - 400 K/uL   nRBC 0.0 0.0 - 0.2 %   Neutrophils Relative % 74 %   Neutro Abs 6.9 1.7 - 7.7 K/uL   Lymphocytes Relative 19 %   Lymphs Abs 1.8 0.7 - 4.0 K/uL   Monocytes Relative 7 %   Monocytes Absolute 0.7 0.1 - 1.0 K/uL   Eosinophils Relative 0 %    Eosinophils Absolute 0.0 0.0 - 0.5 K/uL   Basophils Relative 0 %   Basophils Absolute 0.0 0.0 - 0.1 K/uL   Immature Granulocytes 0 %   Abs Immature Granulocytes 0.04 0.00 - 0.07 K/uL  Comprehensive metabolic panel     Status: Abnormal   Collection Time: 08/29/20  4:05 PM  Result Value Ref Range   Sodium 131 (L) 135 - 145 mmol/L   Potassium 3.7 3.5 - 5.1 mmol/L   Chloride 101 98 - 111 mmol/L   CO2 21 (L) 22 - 32 mmol/L   Glucose, Bld 65 (L) 70 - 99 mg/dL   BUN 9 6 - 20 mg/dL   Creatinine, Ser 1.55 0.44 - 1.00 mg/dL   Calcium 8.7 (L) 8.9 - 10.3 mg/dL   Total Protein 6.8 6.5 - 8.1 g/dL   Albumin 3.0 (L) 3.5 - 5.0 g/dL   AST 30 15 - 41 U/L   ALT 21 0 - 44 U/L   Alkaline Phosphatase 65 38 - 126 U/L   Total Bilirubin 0.5 0.3 - 1.2 mg/dL   GFR, Estimated >20 >80 mL/min   Anion gap 9 5 - 15  Troponin I (High Sensitivity)     Status: None   Collection Time: 08/29/20  4:05 PM  Result Value Ref Range   Troponin I (High Sensitivity) 5 <18 ng/L  Resp Panel by RT-PCR (Flu A&B, Covid) Nasopharyngeal Swab     Status: None   Collection Time: 08/29/20  6:48 PM   Specimen: Nasopharyngeal Swab; Nasopharyngeal(NP) swabs in vial transport medium  Result Value Ref Range   SARS Coronavirus 2 by RT PCR NEGATIVE NEGATIVE   Influenza A by PCR NEGATIVE NEGATIVE   Influenza B by PCR NEGATIVE NEGATIVE   DG CHEST PORT 1 VIEW  Result Date: 08/29/2020 CLINICAL DATA:  25 year old female with shortness of breath. EXAM: PORTABLE CHEST 1 VIEW COMPARISON:  None. FINDINGS: The heart size and mediastinal contours are within normal limits. Both lungs are clear. The visualized skeletal structures are unremarkable. IMPRESSION: No active disease. Electronically Signed   By: Elgie Collard M.D.   On: 08/29/2020 16:19    MDM Patient sent from office for PE evaluation due to SOB Labs, chest xray, EKG, and covid negative  CT pending  Vitals normal & SpO2 >98%  Care turned over to Wynelle Bourgeois CNM Judeth Horn, NP 08/29/2020 8:06 PM   CT Angio Chest Pulmonary Embolism (PE) W or WO Contrast  Result Date: 08/29/2020 CLINICAL DATA:  Shortness of breath EXAM: CT ANGIOGRAPHY CHEST WITH CONTRAST TECHNIQUE: Multidetector CT imaging of the chest was performed using the standard protocol during bolus administration of intravenous contrast. Multiplanar CT image reconstructions and MIPs were obtained to evaluate the vascular anatomy. CONTRAST:  24mL OMNIPAQUE IOHEXOL 350 MG/ML SOLN COMPARISON:  None. FINDINGS: Cardiovascular: No filling defects in the pulmonary arteries to suggest pulmonary emboli. Heart is normal size. Aorta is normal caliber. Mediastinum/Nodes: No mediastinal, hilar, or axillary adenopathy. Trachea and esophagus are unremarkable. Thyroid unremarkable. Lungs/Pleura: Lungs are clear. No focal airspace opacities or suspicious nodules. No effusions. Upper Abdomen: Imaging into the upper abdomen demonstrates no acute findings. Musculoskeletal: Chest wall soft tissues are unremarkable. No acute bony abnormality. Review of the MIP images confirms the above findings. IMPRESSION: No evidence of pulmonary embolus. No acute cardiopulmonary disease. Electronically Signed   By: Charlett Nose M.D.   On: 08/29/2020 20:00   DG CHEST PORT 1 VIEW  Result Date: 08/29/2020 CLINICAL DATA:  25 year old female with shortness of breath. EXAM: PORTABLE CHEST 1 VIEW COMPARISON:  None. FINDINGS: The heart size and mediastinal contours are within normal limits. Both lungs are clear. The visualized skeletal structures are unremarkable. IMPRESSION: No active disease. Electronically Signed   By: Elgie Collard M.D.   On: 08/29/2020 16:19    Called Cardiologist on call, Dr Daphine Deutscher She recommends outpatient Holter monitor and thinks patient is safe for discharge Discussed with patient, who is anxious to go home  Assessment and Plan  A:  Single IUP at [redacted]w[redacted]d       Intermittent palpitations       Shortness of breath with no  evidence for PE  P:   Discharge home       Message sent to office for cardiology referral/holter       Encouraged to return if she develops worsening of symptoms, increase in pain, fever, or other concerning symptoms.  ' Darold Miley L, CNM

## 2020-08-29 NOTE — Progress Notes (Signed)
   PRENATAL VISIT NOTE  Subjective:  Hannah Ponce is a 25 y.o. [id number] at [redacted]w[redacted]d being seen today for ongoing prenatal care.  She is currently monitored for the following issues for this low-risk pregnancy and has Arthralgia; Supervision of low-risk first pregnancy, first trimester; and LGSIL on Pap smear of cervix on their problem list.  Patient reports ongoing shortness of breath which has worsened since her last office visit. Previously only movement made her feel short of breath, however she now reports moving and sitting/lying and talking makes it feel as if she struggling to breathe. She reports an ongoing cough and feeling hot, but denies fever, sore throat, or congestion.  A chest x-ray was ordered at her last visit, however patient was never contacted by radiology to have x-ray done. She reports she made attempts to contact radiology department but never heard back. Contractions: Irritability. Vag. Bleeding: None.  Movement: Present. Denies leaking of fluid.   The following portions of the patient's history were reviewed and updated as appropriate: allergies, current medications, past family history, past medical history, past social history, past surgical history and problem list.   Objective:   Vitals:   08/29/20 1324  BP: 130/78  Pulse: (!) 123  Temp: 98.3 F (36.8 C)  Weight: 120 lb 4.8 oz (54.6 kg)    Fetal Status: Fetal Heart Rate (bpm): 142 Fundal Height: 30 cm Movement: Present     General:  Alert, oriented and cooperative. Patient is in no acute distress.  Skin: Skin is warm and dry. No rash noted.   Cardiovascular: Tachycardia, pulses bounding  Respiratory: Breath sounds clear bilaterally, increased effort, patient appears to have difficulty taking deep breaths when talking  Abdomen: Soft, gravid, appropriate for gestational age.  Pain/Pressure: Absent     Pelvic: Cervical exam deferred        Extremities: Normal range of motion.  Edema: None  Mental Status: Normal  mood and affect. Normal behavior. Normal judgment and thought content.   Assessment and Plan:  Pregnancy: [id number] at [redacted]w[redacted]d  1. Shortness of breath - Respiratory effort increased with talking, sitting, and moving - Breath sounds clear bilaterally, tachycardic, otherwise VSS - Dr. Crissie Reese in room to assess patient as well - Patient to go to MAU for further workup and evaluation, which includes r/o PE - Judeth Horn, NP notified of expected patient arrival  2. [redacted] weeks gestation of pregnancy - Routine OB care   Preterm labor symptoms and general obstetric precautions including but not limited to vaginal bleeding, contractions, leaking of fluid and fetal movement were reviewed in detail with the patient. Please refer to After Visit Summary for other counseling recommendations.    Return in about 2 weeks (around 09/12/2020).  Future Appointments  Date Time Provider Department Center  09/12/2020  3:35 PM Venora Maples, MD [facility]    Brand Males, CNM 08/30/20 1:57 PM

## 2020-08-30 ENCOUNTER — Telehealth: Payer: Self-pay

## 2020-08-30 DIAGNOSIS — Z3401 Encounter for supervision of normal first pregnancy, first trimester: Secondary | ICD-10-CM

## 2020-08-30 DIAGNOSIS — R002 Palpitations: Secondary | ICD-10-CM

## 2020-08-30 DIAGNOSIS — R0602 Shortness of breath: Secondary | ICD-10-CM

## 2020-08-30 NOTE — Telephone Encounter (Signed)
-----   Message from Aviva Signs, CNM sent at 08/29/2020  8:48 PM EDT ----- Regarding: need referral to cardiology Seen for shortness of breath and episodes of tachycardia  Pulm Embolus workup is negative  EKG is normal but she has episodes of tachycardia  Cardiologist recommended outpatient Holter monitor  Can we get her in ASAP?  Thanks  Hilda Lias

## 2020-08-30 NOTE — Telephone Encounter (Signed)
Referral placed to [facility] cardiology.   Judeth Cornfield, RN

## 2020-09-03 ENCOUNTER — Telehealth: Payer: Medicaid Other | Admitting: Physician Assistant

## 2020-09-07 DIAGNOSIS — Z789 Other specified health status: Secondary | ICD-10-CM | POA: Insufficient documentation

## 2020-09-08 ENCOUNTER — Encounter: Payer: Self-pay | Admitting: Cardiology

## 2020-09-08 ENCOUNTER — Ambulatory Visit (INDEPENDENT_AMBULATORY_CARE_PROVIDER_SITE_OTHER): Payer: Medicaid Other | Admitting: Cardiology

## 2020-09-08 ENCOUNTER — Ambulatory Visit: Payer: Medicaid Other

## 2020-09-08 ENCOUNTER — Other Ambulatory Visit: Payer: Self-pay

## 2020-09-08 VITALS — BP 111/74 | HR 100 | Ht 63.0 in | Wt 121.0 lb

## 2020-09-08 DIAGNOSIS — R011 Cardiac murmur, unspecified: Secondary | ICD-10-CM | POA: Insufficient documentation

## 2020-09-08 DIAGNOSIS — R002 Palpitations: Secondary | ICD-10-CM | POA: Diagnosis not present

## 2020-09-08 DIAGNOSIS — R0602 Shortness of breath: Secondary | ICD-10-CM | POA: Diagnosis not present

## 2020-09-08 DIAGNOSIS — R42 Dizziness and giddiness: Secondary | ICD-10-CM | POA: Insufficient documentation

## 2020-09-08 MED ORDER — PROPRANOLOL HCL 10 MG PO TABS: 10.0000 mg | ORAL_TABLET | ORAL | 3 refills | Status: DC | PRN

## 2020-09-08 NOTE — Patient Instructions (Addendum)
Medication Instructions:  Your physician has recommended you make the following change in your medication:  START: Propranolol 10 mg at night as needed if heart rate is greater then 150 beats per minute.   *If you need a refill on your cardiac medications before your next appointment, please call your pharmacy*   Lab Work: None If you have labs (blood work) drawn today and your tests are completely normal, you will receive your results only by: MyChart Message (if you have MyChart) OR A paper copy in the mail If you have any lab test that is abnormal or we need to change your treatment, we will call you to review the results.   Testing/Procedures: Your physician has requested that you have an echocardiogram. Echocardiography is a painless test that uses sound waves to create images of your heart. It provides your doctor with information about the size and shape of your heart and how well your heart's chambers and valves are working. This procedure takes approximately one hour. There are no restrictions for this procedure.  A zio monitor was ordered today. It will remain on for 7 days. You will then return monitor and event diary in provided box. It takes 1-2 weeks for report to be downloaded and returned to [place]. We will call you with the results. If monitor falls off or has orange flashing light, please call Zio for further instructions.    Follow-Up: At [facility], you and your health needs are our priority.  As part of our continuing mission to provide you with exceptional heart care, we have created designated Provider Care Teams.  These Care Teams include your primary Cardiologist (physician) and Advanced Practice Providers (APPs -  Physician Assistants and Nurse Practitioners) who all work together to provide you with the care you need, when you need it.  We recommend signing up for the patient portal called "MyChart".  Sign up information is provided on this After Visit Summary.   MyChart is used to connect with patients for Virtual Visits (Telemedicine).  Patients are able to view lab/test results, encounter notes, upcoming appointments, etc.  Non-urgent messages can be sent to your provider as well.   To learn more about what you can do with MyChart, go to [email or web address].    Your next appointment:   6 week(s)  The format for your next appointment:   In Person  Provider:   Thomasene Ripple - [facility]    Other Instructions Echocardiogram An echocardiogram is a test that uses sound waves (ultrasound) to produce images of the heart. Images from an echocardiogram can provide important information about: Heart size and shape. The size and thickness and movement of your heart's walls. Heart muscle function and strength. Heart valve function or if you have stenosis. Stenosis is when the heart valves are too narrow. If blood is flowing backward through the heart valves (regurgitation). A tumor or infectious growth around the heart valves. Areas of heart muscle that are not working well because of poor blood flow or injury from a heart attack. Aneurysm detection. An aneurysm is a weak or damaged part of an artery wall. The wall bulges out from the normal force of blood pumping through the body. Tell a health care provider about: Any allergies you have. All medicines you are taking, including vitamins, herbs, eye drops, creams, and over-the-counter medicines. Any blood disorders you have. Any surgeries you have had. Any medical conditions you have. Whether you are pregnant or may be pregnant. What are  the risks? Generally, this is a safe test. However, problems may occur, including an allergic reaction to dye (contrast) that may be used during the test. What happens before the test? No specific preparation is needed. You may eat and drink normally. What happens during the test?  You will take off your clothes from the waist up and put on a hospital  gown. Electrodes or electrocardiogram (ECG)patches may be placed on your chest. The electrodes or patches are then connected to a device that monitors your heart rate and rhythm. You will lie down on a table for an ultrasound exam. A gel will be applied to your chest to help sound waves pass through your skin. A handheld device, called a transducer, will be pressed against your chest and moved over your heart. The transducer produces sound waves that travel to your heart and bounce back (or "echo" back) to the transducer. These sound waves will be captured in real-time and changed into images of your heart that can be viewed on a video monitor. The images will be recorded on a computer and reviewed by your health care provider. You may be asked to change positions or hold your breath for a short time. This makes it easier to get different views or better views of your heart. In some cases, you may receive contrast through an IV in one of your veins. This can improve the quality of the pictures from your heart. The procedure may vary among health care providers and hospitals. What can I expect after the test? You may return to your normal, everyday life, including diet, activities, andmedicines, unless your health care provider tells you not to do that. Follow these instructions at home: It is up to you to get the results of your test. Ask your health care provider, or the department that is doing the test, when your results will be ready. Keep all follow-up visits. This is important. Summary An echocardiogram is a test that uses sound waves (ultrasound) to produce images of the heart. Images from an echocardiogram can provide important information about the size and shape of your heart, heart muscle function, heart valve function, and other possible heart problems. You do not need to do anything to prepare before this test. You may eat and drink normally. After the echocardiogram is completed, you  may return to your normal, everyday life, unless your health care provider tells you not to do that. This information is not intended to replace advice given to you by your health care provider. Make sure you discuss any questions you have with your healthcare provider. Document Revised: 10/12/2019 Document Reviewed: 10/12/2019 Elsevier Patient Education  2022 Elsevier Inc.   ZIO  WHY IS MY DOCTOR PRESCRIBING ZIO? The Zio system is proven and trusted by physicians to detect and diagnose irregular heart rhythms -- and has been prescribed to hundreds of thousands of patients.  The FDA has cleared the Zio system to monitor for many different kinds of irregular heart rhythms. In a study, physicians were able to reach a diagnosis 90% of the time with the Zio system1.  You can wear the Zio monitor -- a small, discreet, comfortable patch -- during your normal day-to-day activity, including while you sleep, shower, and exercise, while it records every single heartbeat for analysis.  1Barrett, P., et al. Comparison of 24 Hour Holter Monitoring Versus 14 Day Novel Adhesive Patch Electrocardiographic Monitoring. American Journal of Medicine, 2014.  ZIO VS. HOLTER MONITORING The Zio monitor can be  comfortably worn for up to 14 days. Holter monitors can be worn for 24 to 48 hours, limiting the time to record any irregular heart rhythms you may have. Zio is able to capture data for the 51% of patients who have their first symptom-triggered arrhythmia after 48 hours.1  LIVE WITHOUT RESTRICTIONS The Zio ambulatory cardiac monitor is a small, unobtrusive, and water-resistant patch--you might even forget you're wearing it. The Zio monitor records and stores every beat of your heart, whether you're sleeping, working out, or showering. Remove on: July 15th 2022

## 2020-09-08 NOTE — Progress Notes (Signed)
Cardio-Obstetrics Clinic  New Evaluation  Date:  09/08/2020   ID:  Hannah Ponce, DOB 04/16/1995, MRN [id number]  PCP:  Patient, No Pcp Per (Inactive)   [facility] Providers Cardiologist:  Thomasene Ripple, DO  Electrophysiologist:  None       Referring MD: Aviva Signs, CNM   Chief Complaint: Palpitations   History of Present Illness:    Hannah Ponce is a 25 y.o. female [[id number]] who is 31w 5 days pregnant is being seen today for the evaluation of palpitations and shortness of breath at the request of Aviva Signs, CNM.   The patient reports that recently she had been experiencing intermittent palpitations. She describes it as an abrupt onset of fast heart beat. She tells me that it does not matter what she is doing she gets hit with her rate instantly going up to 150s and then last for few minutes. She states when this happens she gets dizzy and lightheaded.   Another problem she encounters is the worsening shortness of breath on exertion and now at rest.She has had a few ED visit recently for her palpitations and worsening shortness of breath.   She denies syncope. She is not aware of cardiomyopathy is any first degree relatives.    Prior CV Studies Reviewed: The following studies were reviewed today:   Past Medical History:  Diagnosis Date   Medical history non-contributory     Past Surgical History:  Procedure Laterality Date   NO PAST SURGERIES        OB History     Gravida  4   Para  2   Term  2   Preterm  0   AB  1   Living  2      SAB  0   IAB  1   Ectopic  0   Multiple  0   Live Births  2               Current Medications: Current Meds  Medication Sig   B Complex-C (B-COMPLEX WITH VITAMIN C) tablet Take 1 tablet by mouth daily. Take with food   ferrous sulfate 325 (65 FE) MG tablet Take 1 tablet (325 mg total) by mouth daily with breakfast.   Prenatal MV & Min w/FA-DHA (PRENATAL GUMMIES PO) Take by mouth.    propranolol (INDERAL) 10 MG tablet Take 1 tablet (10 mg total) by mouth as needed (At night if heart rate is higher then 150 beats per minute).     Allergies:   Avocado   Social History   Socioeconomic History   Marital status: Single    Spouse name: Not on file   Number of children: Not on file   Years of education: Not on file   Highest education level: Not on file  Occupational History   Not on file  Tobacco Use   Smoking status: Former    Packs/day: 0.00    Pack years: 0.00    Types: Cigarettes   Smokeless tobacco: Never  Vaping Use   Vaping Use: Never used  Substance and Sexual Activity   Alcohol use: Not Currently   Drug use: Not Currently   Sexual activity: Not on file  Other Topics Concern   Not on file  Social History Narrative   ** Merged History Encounter **       Social Determinants of Health   Financial Resource Strain: Not on file  Food Insecurity: No Food Insecurity   Worried  About Running Out of Food in the Last Year: Never true   Ran Out of Food in the Last Year: Never true  Transportation Needs: No Transportation Needs   Lack of Transportation (Medical): No   Lack of Transportation (Non-Medical): No  Physical Activity: Not on file  Stress: Not on file  Social Connections: Not on file      Family History  Problem Relation Age of Onset   Healthy Mother       ROS:   Please see the history of present illness.     Reports palpitation, shortness of breath and dizziness- All other systems reviewed and are negative.   Labs/EKG Reviewed:    EKG:    The ekg ordered today demonstrates sinus rhythm heart rate 92 bpm  Recent Labs: 08/29/2020: ALT 21; BUN 9; Creatinine, Ser 0.62; Hemoglobin 11.3; Platelets 248; Potassium 3.7; Sodium 131   Recent Lipid Panel No results found for: CHOL, TRIG, HDL, CHOLHDL, LDLCALC, LDLDIRECT  Physical Exam:    VS:  BP 111/74 (BP Location: Left Arm, Patient Position: Sitting, Cuff Size: Normal)   Pulse 100    Ht  (1.6 m)   Wt 121 lb (54.9 kg)   LMP 01/30/2020   SpO2 99%   BMI 21.43 kg/m     Wt Readings from Last 3 Encounters:  09/08/20 121 lb (54.9 kg)  08/29/20 120 lb 4.8 oz (54.6 kg)  08/15/20 120 lb 1.6 oz (54.5 kg)     GEN:  Well nourished, well developed in no acute distress HEENT: Normal NECK: No JVD; No carotid bruits LYMPHATICS: No lymphadenopathy CARDIAC: RRR, +2/6 holosystolic murmurs, rubs, gallops RESPIRATORY:  Clear to auscultation without rales, wheezing or rhonchi  ABDOMEN: Soft, non-tender, non-distended MUSCULOSKELETAL:  No edema; No deformity  SKIN: Warm and dry NEUROLOGIC:  Alert and oriented x 3 PSYCHIATRIC:  Normal affect    Risk Assessment/Risk Calculators:     CARPREG II Risk Prediction Index Score:  1.  The patient's risk for a primary cardiac event is 5%.            ASSESSMENT & PLAN:    Palpitations Dizziness Shortness of breath  Her palpitations with the symptoms of dizziness are concerning as her HR goes up to 160s and above. I am placing a monitor on the patient to rule out cardiac arrhythmia. In the mean time I will also give her propanolol 10 mg at night as needed as long as her SBP is greater the 110 mmHg and HR is greater than 150 mg.   With her shortness of breath and limited family history, an echo will be order to rule out left ventricular dysfunction and other structural abnormalities.   She has a systolic murmur which I suspect is her physiologic/pregnancy murmur.   Patient Instructions  Medication Instructions:  Your physician has recommended you make the following change in your medication:  START: Propranolol 10 mg at night as needed if heart rate is greater then 150 beats per minute.   *If you need a refill on your cardiac medications before your next appointment, please call your pharmacy*   Lab Work: None If you have labs (blood work) drawn today and your tests are completely normal, you will receive your results  only by: MyChart Message (if you have MyChart) OR A paper copy in the mail If you have any lab test that is abnormal or we need to change your treatment, we will call you to review the results.  Testing/Procedures: Your physician has requested that you have an echocardiogram. Echocardiography is a painless test that uses sound waves to create images of your heart. It provides your doctor with information about the size and shape of your heart and how well your heart's chambers and valves are working. This procedure takes approximately one hour. There are no restrictions for this procedure.  A zio monitor was ordered today. It will remain on for 7 days. You will then return monitor and event diary in provided box. It takes 1-2 weeks for report to be downloaded and returned to [place]. We will call you with the results. If monitor falls off or has orange flashing light, please call Zio for further instructions.    Follow-Up: At [facility], you and your health needs are our priority.  As part of our continuing mission to provide you with exceptional heart care, we have created designated Provider Care Teams.  These Care Teams include your primary Cardiologist (physician) and Advanced Practice Providers (APPs -  Physician Assistants and Nurse Practitioners) who all work together to provide you with the care you need, when you need it.  We recommend signing up for the patient portal called "MyChart".  Sign up information is provided on this After Visit Summary.  MyChart is used to connect with patients for Virtual Visits (Telemedicine).  Patients are able to view lab/test results, encounter notes, upcoming appointments, etc.  Non-urgent messages can be sent to your provider as well.   To learn more about what you can do with MyChart, go to [email or web address].    Your next appointment:   6 week(s)  The format for your next appointment:   In Person  Provider:   Thomasene Ripple - [facility]    Other Instructions Echocardiogram An echocardiogram is a test that uses sound waves (ultrasound) to produce images of the heart. Images from an echocardiogram can provide important information about: Heart size and shape. The size and thickness and movement of your heart's walls. Heart muscle function and strength. Heart valve function or if you have stenosis. Stenosis is when the heart valves are too narrow. If blood is flowing backward through the heart valves (regurgitation). A tumor or infectious growth around the heart valves. Areas of heart muscle that are not working well because of poor blood flow or injury from a heart attack. Aneurysm detection. An aneurysm is a weak or damaged part of an artery wall. The wall bulges out from the normal force of blood pumping through the body. Tell a health care provider about: Any allergies you have. All medicines you are taking, including vitamins, herbs, eye drops, creams, and over-the-counter medicines. Any blood disorders you have. Any surgeries you have had. Any medical conditions you have. Whether you are pregnant or may be pregnant. What are the risks? Generally, this is a safe test. However, problems may occur, including an allergic reaction to dye (contrast) that may be used during the test. What happens before the test? No specific preparation is needed. You may eat and drink normally. What happens during the test?  You will take off your clothes from the waist up and put on a hospital gown. Electrodes or electrocardiogram (ECG)patches may be placed on your chest. The electrodes or patches are then connected to a device that monitors your heart rate and rhythm. You will lie down on a table for an ultrasound exam. A gel will be applied to your chest to help sound waves pass through your skin.  A handheld device, called a transducer, will be pressed against your chest and moved over your heart. The transducer produces sound  waves that travel to your heart and bounce back (or "echo" back) to the transducer. These sound waves will be captured in real-time and changed into images of your heart that can be viewed on a video monitor. The images will be recorded on a computer and reviewed by your health care provider. You may be asked to change positions or hold your breath for a short time. This makes it easier to get different views or better views of your heart. In some cases, you may receive contrast through an IV in one of your veins. This can improve the quality of the pictures from your heart. The procedure may vary among health care providers and hospitals. What can I expect after the test? You may return to your normal, everyday life, including diet, activities, andmedicines, unless your health care provider tells you not to do that. Follow these instructions at home: It is up to you to get the results of your test. Ask your health care provider, or the department that is doing the test, when your results will be ready. Keep all follow-up visits. This is important. Summary An echocardiogram is a test that uses sound waves (ultrasound) to produce images of the heart. Images from an echocardiogram can provide important information about the size and shape of your heart, heart muscle function, heart valve function, and other possible heart problems. You do not need to do anything to prepare before this test. You may eat and drink normally. After the echocardiogram is completed, you may return to your normal, everyday life, unless your health care provider tells you not to do that. This information is not intended to replace advice given to you by your health care provider. Make sure you discuss any questions you have with your healthcare provider. Document Revised: 10/12/2019 Document Reviewed: 10/12/2019 Elsevier Patient Education  2022 Elsevier Inc.   ZIO  WHY IS MY DOCTOR PRESCRIBING ZIO? The Zio system is  proven and trusted by physicians to detect and diagnose irregular heart rhythms -- and has been prescribed to hundreds of thousands of patients.  The FDA has cleared the Zio system to monitor for many different kinds of irregular heart rhythms. In a study, physicians were able to reach a diagnosis 90% of the time with the Zio system1.  You can wear the Zio monitor -- a small, discreet, comfortable patch -- during your normal day-to-day activity, including while you sleep, shower, and exercise, while it records every single heartbeat for analysis.  1Barrett, P., et al. Comparison of 24 Hour Holter Monitoring Versus 14 Day Novel Adhesive Patch Electrocardiographic Monitoring. American Journal of Medicine, 2014.  ZIO VS. HOLTER MONITORING The Zio monitor can be comfortably worn for up to 14 days. Holter monitors can be worn for 24 to 48 hours, limiting the time to record any irregular heart rhythms you may have. Zio is able to capture data for the 51% of patients who have their first symptom-triggered arrhythmia after 48 hours.1  LIVE WITHOUT RESTRICTIONS The Zio ambulatory cardiac monitor is a small, unobtrusive, and water-resistant patch--you might even forget you're wearing it. The Zio monitor records and stores every beat of your heart, whether you're sleeping, working out, or showering. Remove on: July 15th 2022    Dispo:  No follow-ups on file.   Medication Adjustments/Labs and Tests Ordered: Current medicines are reviewed at length with the  patient today.  Concerns regarding medicines are outlined above.  Tests Ordered: Orders Placed This Encounter  Procedures   LONG TERM MONITOR (3-14 DAYS)   ECHOCARDIOGRAM COMPLETE    Medication Changes: Meds ordered this encounter  Medications   propranolol (INDERAL) 10 MG tablet    Sig: Take 1 tablet (10 mg total) by mouth as needed (At night if heart rate is higher then 150 beats per minute).    Dispense:  45 tablet    Refill:  3

## 2020-09-11 ENCOUNTER — Encounter (HOSPITAL_COMMUNITY): Payer: Self-pay

## 2020-09-12 ENCOUNTER — Ambulatory Visit (INDEPENDENT_AMBULATORY_CARE_PROVIDER_SITE_OTHER): Payer: Medicaid Other | Admitting: Family Medicine

## 2020-09-12 ENCOUNTER — Other Ambulatory Visit: Payer: Self-pay

## 2020-09-12 VITALS — BP 112/77 | HR 90 | Wt 121.7 lb

## 2020-09-12 DIAGNOSIS — R0602 Shortness of breath: Secondary | ICD-10-CM

## 2020-09-12 DIAGNOSIS — R87612 Low grade squamous intraepithelial lesion on cytologic smear of cervix (LGSIL): Secondary | ICD-10-CM

## 2020-09-12 DIAGNOSIS — Z3401 Encounter for supervision of normal first pregnancy, first trimester: Secondary | ICD-10-CM

## 2020-09-12 NOTE — Patient Instructions (Signed)

## 2020-09-12 NOTE — Progress Notes (Signed)
   Subjective:  Hannah Ponce is a 25 y.o. [phone] at [redacted]w[redacted]d being seen today for ongoing prenatal care.  She is currently monitored for the following issues for this low-risk pregnancy and has Arthralgia; Supervision of low-risk first pregnancy, first trimester; LGSIL on Pap smear of cervix; Medical history non-contributory; Palpitations; SOB (shortness of breath); Dizziness; and Murmur, cardiac on their problem list.  Patient reports no complaints.  Contractions: Irritability. Vag. Bleeding: None.  Movement: Present. Denies leaking of fluid.   The following portions of the patient's history were reviewed and updated as appropriate: allergies, current medications, past family history, past medical history, past social history, past surgical history and problem list. Problem list updated.  Objective:   Vitals:   09/12/20 1608  BP: 112/77  Pulse: 90  Weight: 121 lb 11.2 oz (55.2 kg)    Fetal Status: Fetal Heart Rate (bpm): 140 Fundal Height: 31 cm Movement: Present     General:  Alert, oriented and cooperative. Patient is in no acute distress.  Skin: Skin is warm and dry. No rash noted.   Cardiovascular: Normal heart rate noted  Respiratory: Normal respiratory effort, no problems with respiration noted  Abdomen: Soft, gravid, appropriate for gestational age. Pain/Pressure: Absent     Pelvic: Vag. Bleeding: None     Cervical exam deferred        Extremities: Normal range of motion.  Edema: None  Mental Status: Normal mood and affect. Normal behavior. Normal judgment and thought content.   Urinalysis:      Assessment and Plan:  Pregnancy: [id number] at [redacted]w[redacted]d  1. Supervision of low-risk first pregnancy, first trimester BP, FH, FHR normal Discussed post partum BTL at length, as she has 5 children between her and her partner with this pregnancy Discussed other LARC methods are available but she is at low risk of regret overall She will continue to consider Mistakenly thought she had  commercial insurance at this visit, message sent to clinical staff to have her come to the office ASAP to sign the consent form  2. SOB (shortness of breath) Sent to MAU after last visit to rule out PE, CTPA was normal Saw Cardio Ob recently for this and palpitations Currently has TTE scheduled for later this month and is wearing a Zio patch Follow up Cardio OB recs  3. LGSIL on Pap smear of cervix Will need colpo PP as there was no HPV result  Preterm labor symptoms and general obstetric precautions including but not limited to vaginal bleeding, contractions, leaking of fluid and fetal movement were reviewed in detail with the patient. Please refer to After Visit Summary for other counseling recommendations.  Return in 2 weeks (on 09/26/2020).   Venora Maples, MD

## 2020-09-13 ENCOUNTER — Telehealth: Payer: Self-pay

## 2020-09-13 NOTE — Telephone Encounter (Signed)
Call placed to pt. Left detailed message for pt to come by front office to sign BTL consent ASAP this week. Will send mychart message.   Judeth Cornfield, RN

## 2020-09-13 NOTE — Telephone Encounter (Addendum)
-----   Message from Venora Maples, MD sent at 09/12/2020 11:03 PM EDT ----- Regarding: BTL Consent   Can someone call her to come into the office to complete the consent form ASAP?  Thanks, [facility]

## 2020-09-27 ENCOUNTER — Other Ambulatory Visit: Payer: Self-pay

## 2020-09-27 ENCOUNTER — Ambulatory Visit (INDEPENDENT_AMBULATORY_CARE_PROVIDER_SITE_OTHER): Payer: Medicaid Other | Admitting: Obstetrics and Gynecology

## 2020-09-27 ENCOUNTER — Encounter: Payer: Self-pay | Admitting: Obstetrics and Gynecology

## 2020-09-27 VITALS — Wt 118.9 lb

## 2020-09-27 DIAGNOSIS — Z3A34 34 weeks gestation of pregnancy: Secondary | ICD-10-CM

## 2020-09-27 DIAGNOSIS — Z3009 Encounter for other general counseling and advice on contraception: Secondary | ICD-10-CM

## 2020-09-27 DIAGNOSIS — Z3401 Encounter for supervision of normal first pregnancy, first trimester: Secondary | ICD-10-CM

## 2020-09-27 NOTE — Progress Notes (Signed)
   PRENATAL VISIT NOTE  Subjective:  Hannah Ponce is a 25 y.o. [phone] at [redacted]w[redacted]d being seen today for ongoing prenatal care.  She is currently monitored for the following issues for this low-risk pregnancy and has Arthralgia; Supervision of low-risk first pregnancy, first trimester; LGSIL on Pap smear of cervix; Medical history non-contributory; Palpitations; SOB (shortness of breath); Dizziness; Murmur, cardiac; and Unwanted fertility on their problem list.  Patient reports no complaints.  Contractions: Not present. Vag. Bleeding: None.  Movement: Present. Denies leaking of fluid.   The following portions of the patient's history were reviewed and updated as appropriate: allergies, current medications, past family history, past medical history, past social history, past surgical history and problem list.   Objective:   Vitals:   09/27/20 1533  Weight: 118 lb 14.4 oz (53.9 kg)    Fetal Status: Fetal Heart Rate (bpm): 168   Movement: Present     General:  Alert, oriented and cooperative. Patient is in no acute distress.  Skin: Skin is warm and dry. No rash noted.   Cardiovascular: Normal heart rate noted  Respiratory: Normal respiratory effort, no problems with respiration noted  Abdomen: Soft, gravid, appropriate for gestational age.  Pain/Pressure: Absent     Pelvic: Cervical exam deferred        Extremities: Normal range of motion.  Edema: None  Mental Status: Normal mood and affect. Normal behavior. Normal judgment and thought content.   Assessment and Plan:  Pregnancy: [id number] at [redacted]w[redacted]d  1. Supervision of low-risk first pregnancy, first trimester  2. [redacted] weeks gestation of pregnancy  3. Unwanted fertility BTL papers signed today   Preterm labor symptoms and general obstetric precautions including but not limited to vaginal bleeding, contractions, leaking of fluid and fetal movement were reviewed in detail with the patient. Please refer to After Visit Summary for other  counseling recommendations.   Return in about 2 weeks (around 10/11/2020) for low OB, in person, 36 week swabs.  Future Appointments  Date Time Provider Department Center  09/28/2020  2:05 PM [facility] ECHO 5 [id number]  10/20/2020  2:00 PM Tobb, [place]ona [place], DO [facility] None    Conan Bowens, MD

## 2020-09-28 ENCOUNTER — Other Ambulatory Visit (HOSPITAL_COMMUNITY): Payer: Medicaid Other

## 2020-10-02 ENCOUNTER — Telehealth: Payer: Self-pay

## 2020-10-02 NOTE — Telephone Encounter (Signed)
Call placed to pt regarding needing new BTL consent. No answer on both numbers given by pt. Did leave VM on pt's mobile number to return to office to sign BTL papers this week.   Judeth Cornfield, RN

## 2020-10-02 NOTE — Telephone Encounter (Signed)
-----   Message from Armen Pickup, RN sent at 10/02/2020  1:13 PM EDT ----- Regarding: Sterilization Consent Please contact patient to re-sign her consent.  There is an overcorrection on her signature.  Please have her come in ASAP - she is 34 weeks.  She will need to sign the new forms sent out on 10-02-20 by [place].  Thank you,  Kennon Rounds

## 2020-10-12 ENCOUNTER — Other Ambulatory Visit (HOSPITAL_COMMUNITY): Payer: Medicaid Other

## 2020-10-17 ENCOUNTER — Encounter: Payer: Medicaid Other | Admitting: Family Medicine

## 2020-10-20 ENCOUNTER — Ambulatory Visit: Payer: Medicaid Other | Admitting: Cardiology

## 2020-10-24 ENCOUNTER — Ambulatory Visit (HOSPITAL_COMMUNITY): Payer: Medicaid Other | Attending: Cardiology

## 2020-10-24 ENCOUNTER — Encounter (HOSPITAL_COMMUNITY): Payer: Self-pay | Admitting: Cardiology

## 2020-10-24 ENCOUNTER — Telehealth (HOSPITAL_COMMUNITY): Payer: Self-pay | Admitting: Cardiology

## 2020-10-24 NOTE — Telephone Encounter (Signed)
Good Morning,  I wanted to let you know that the OB patient NO SHOWED for her echocardiogram on 10/24/20.  We will mail a letter to have patient to call the office to reschedule. Thank you.

## 2020-10-29 ENCOUNTER — Inpatient Hospital Stay (HOSPITAL_COMMUNITY)
Admission: AD | Admit: 2020-10-29 | Discharge: 2020-10-31 | DRG: 806 | Disposition: A | Discharge status: Home / Self Care | Payer: Medicaid Other | Attending: Obstetrics and Gynecology | Admitting: Obstetrics and Gynecology

## 2020-10-29 ENCOUNTER — Encounter (HOSPITAL_COMMUNITY): Payer: Self-pay | Admitting: Obstetrics and Gynecology

## 2020-10-29 ENCOUNTER — Other Ambulatory Visit: Payer: Self-pay

## 2020-10-29 DIAGNOSIS — Z87891 Personal history of nicotine dependence: Secondary | ICD-10-CM

## 2020-10-29 DIAGNOSIS — O9903 Anemia complicating the puerperium: Secondary | ICD-10-CM | POA: Diagnosis not present

## 2020-10-29 DIAGNOSIS — O26893 Other specified pregnancy related conditions, third trimester: Secondary | ICD-10-CM | POA: Diagnosis present

## 2020-10-29 DIAGNOSIS — Z3A39 39 weeks gestation of pregnancy: Secondary | ICD-10-CM

## 2020-10-29 DIAGNOSIS — O4202 Full-term premature rupture of membranes, onset of labor within 24 hours of rupture: Secondary | ICD-10-CM | POA: Diagnosis not present

## 2020-10-29 DIAGNOSIS — Z880 Allergy status to penicillin: Secondary | ICD-10-CM | POA: Diagnosis not present

## 2020-10-29 DIAGNOSIS — O9081 Anemia of the puerperium: Secondary | ICD-10-CM | POA: Diagnosis not present

## 2020-10-29 DIAGNOSIS — D62 Acute posthemorrhagic anemia: Secondary | ICD-10-CM | POA: Diagnosis not present

## 2020-10-29 DIAGNOSIS — R87612 Low grade squamous intraepithelial lesion on cytologic smear of cervix (LGSIL): Secondary | ICD-10-CM | POA: Diagnosis present

## 2020-10-29 LAB — ABO/RH: ABO/RH(D): O POS

## 2020-10-29 LAB — CBC WITH DIFFERENTIAL/PLATELET
Abs Immature Granulocytes: 0.05 10*3/uL (ref 0.00–0.07)
Basophils Absolute: 0 10*3/uL (ref 0.0–0.1)
Basophils Relative: 0 %
Eosinophils Absolute: 0 10*3/uL (ref 0.0–0.5)
Eosinophils Relative: 0 %
HCT: 30.4 % — ABNORMAL LOW (ref 36.0–46.0)
Hemoglobin: 10 g/dL — ABNORMAL LOW (ref 12.0–15.0)
Immature Granulocytes: 0 %
Lymphocytes Relative: 19 %
Lymphs Abs: 2.7 10*3/uL (ref 0.7–4.0)
MCH: 28.5 pg (ref 26.0–34.0)
MCHC: 32.9 g/dL (ref 30.0–36.0)
MCV: 86.6 fL (ref 80.0–100.0)
Monocytes Absolute: 1.1 10*3/uL — ABNORMAL HIGH (ref 0.1–1.0)
Monocytes Relative: 8 %
Neutro Abs: 9.9 10*3/uL — ABNORMAL HIGH (ref 1.7–7.7)
Neutrophils Relative %: 73 %
Platelets: 248 10*3/uL (ref 150–400)
RBC: 3.51 MIL/uL — ABNORMAL LOW (ref 3.87–5.11)
RDW: 12.4 % (ref 11.5–15.5)
WBC: 13.7 10*3/uL — ABNORMAL HIGH (ref 4.0–10.5)
nRBC: 0 % (ref 0.0–0.2)

## 2020-10-29 LAB — COMPREHENSIVE METABOLIC PANEL
ALT: 63 U/L — ABNORMAL HIGH (ref 0–44)
AST: 79 U/L — ABNORMAL HIGH (ref 15–41)
Albumin: 2.5 g/dL — ABNORMAL LOW (ref 3.5–5.0)
Alkaline Phosphatase: 103 U/L (ref 38–126)
Anion gap: 8 (ref 5–15)
BUN: 9 mg/dL (ref 6–20)
CO2: 22 mmol/L (ref 22–32)
Calcium: 8.5 mg/dL — ABNORMAL LOW (ref 8.9–10.3)
Chloride: 104 mmol/L (ref 98–111)
Creatinine, Ser: 0.83 mg/dL (ref 0.44–1.00)
GFR, Estimated: >60 mL/min (ref >60)
Glucose, Bld: 93 mg/dL (ref 70–99)
Potassium: 4 mmol/L (ref 3.5–5.1)
Sodium: 134 mmol/L — ABNORMAL LOW (ref 135–145)
Total Bilirubin: 0.6 mg/dL (ref 0.3–1.2)
Total Protein: 5.8 g/dL — ABNORMAL LOW (ref 6.5–8.1)

## 2020-10-29 LAB — CBC
HCT: 32.2 % — ABNORMAL LOW (ref 36.0–46.0)
Hemoglobin: 10.8 g/dL — ABNORMAL LOW (ref 12.0–15.0)
MCH: 28.4 pg (ref 26.0–34.0)
MCHC: 33.5 g/dL (ref 30.0–36.0)
MCV: 84.7 fL (ref 80.0–100.0)
Platelets: 234 10*3/uL (ref 150–400)
RBC: 3.8 MIL/uL — ABNORMAL LOW (ref 3.87–5.11)
RDW: 12.4 % (ref 11.5–15.5)
WBC: 6.8 10*3/uL (ref 4.0–10.5)
nRBC: 0 % (ref 0.0–0.2)

## 2020-10-29 LAB — RPR: RPR Ser Ql: NONREACTIVE

## 2020-10-29 LAB — POSTPARTUM HEMORRHAGE PROTOCOL (BB NOTIFICATION)

## 2020-10-29 MED ORDER — ACETAMINOPHEN 325 MG PO TABS: 650.0000 mg | ORAL_TABLET | ORAL | Status: DC | PRN

## 2020-10-29 MED ORDER — FENTANYL CITRATE (PF) 100 MCG/2ML IJ SOLN
50.0000 ug | INTRAMUSCULAR | Status: DC | PRN
Start: 2020-10-29 — End: 2020-10-31

## 2020-10-29 MED ORDER — COCONUT OIL OIL: 1.0000 "application " | TOPICAL_OIL | Status: DC | PRN

## 2020-10-29 MED ORDER — OXYTOCIN-SODIUM CHLORIDE 30-0.9 UT/500ML-% IV SOLN: 10.0000 [IU]/h | INTRAVENOUS | Status: DC

## 2020-10-29 MED ORDER — DIBUCAINE (PERIANAL) 1 % EX OINT: 1.0000 "application " | TOPICAL_OINTMENT | CUTANEOUS | Status: DC | PRN

## 2020-10-29 MED ORDER — DIPHENHYDRAMINE HCL 25 MG PO CAPS: 25.0000 mg | ORAL_CAPSULE | Freq: Four times a day (QID) | ORAL | Status: DC | PRN

## 2020-10-29 MED ORDER — LIDOCAINE HCL (PF) 1 % IJ SOLN
30.0000 mL | INTRAMUSCULAR | Status: DC | PRN
  Filled 2020-10-29: qty 30

## 2020-10-29 MED ORDER — FENTANYL CITRATE (PF) 100 MCG/2ML IJ SOLN: 25.0000 ug | INTRAMUSCULAR | Status: DC | PRN

## 2020-10-29 MED ORDER — TRANEXAMIC ACID-NACL 1000-0.7 MG/100ML-% IV SOLN
INTRAVENOUS | Status: AC
  Filled 2020-10-29: qty 100

## 2020-10-29 MED ORDER — TRANEXAMIC ACID-NACL 1000-0.7 MG/100ML-% IV SOLN: 1000.0000 mg | Freq: Once | INTRAVENOUS | Status: DC | PRN

## 2020-10-29 MED ORDER — PRENATAL MULTIVITAMIN CH
1.0000 | ORAL_TABLET | Freq: Every day | ORAL | Status: DC
Start: 2020-10-29 — End: 2020-10-31
  Administered 2020-10-30 – 2020-10-31 (×2): 1 via ORAL
  Filled 2020-10-29 (×2): qty 1

## 2020-10-29 MED ORDER — SOD CITRATE-CITRIC ACID 500-334 MG/5ML PO SOLN: 30.0000 mL | ORAL | Status: DC | PRN

## 2020-10-29 MED ORDER — OXYTOCIN 10 UNIT/ML IJ SOLN
INTRAMUSCULAR | Status: AC
  Administered 2020-10-29: 10 [IU] via INTRAMUSCULAR
  Filled 2020-10-29: qty 1

## 2020-10-29 MED ORDER — ONDANSETRON HCL 4 MG/2ML IJ SOLN
4.0000 mg | Freq: Four times a day (QID) | INTRAMUSCULAR | Status: DC | PRN
  Filled 2020-10-29: qty 2

## 2020-10-29 MED ORDER — SIMETHICONE 80 MG PO CHEW: 80.0000 mg | CHEWABLE_TABLET | ORAL | Status: DC | PRN

## 2020-10-29 MED ORDER — METHYLERGONOVINE MALEATE 0.2 MG/ML IJ SOLN: 0.2000 mg | Freq: Once | INTRAMUSCULAR | Status: DC

## 2020-10-29 MED ORDER — ZOLPIDEM TARTRATE 5 MG PO TABS: 5.0000 mg | ORAL_TABLET | Freq: Every evening | ORAL | Status: DC | PRN

## 2020-10-29 MED ORDER — WITCH HAZEL-GLYCERIN EX PADS: 1.0000 "application " | MEDICATED_PAD | CUTANEOUS | Status: DC | PRN

## 2020-10-29 MED ORDER — FENTANYL CITRATE (PF) 100 MCG/2ML IJ SOLN
INTRAMUSCULAR | Status: AC
  Administered 2020-10-29: 100 ug via INTRAVENOUS
  Filled 2020-10-29: qty 2

## 2020-10-29 MED ORDER — ONDANSETRON HCL 4 MG PO TABS: 4.0000 mg | ORAL_TABLET | ORAL | Status: DC | PRN

## 2020-10-29 MED ORDER — METHYLERGONOVINE MALEATE 0.2 MG/ML IJ SOLN
INTRAMUSCULAR | Status: AC
  Administered 2020-10-29: 0.2 mg
  Filled 2020-10-29: qty 3

## 2020-10-29 MED ORDER — TRANEXAMIC ACID-NACL 1000-0.7 MG/100ML-% IV SOLN: 1000.0000 mg | INTRAVENOUS | Status: AC

## 2020-10-29 MED ORDER — SENNOSIDES-DOCUSATE SODIUM 8.6-50 MG PO TABS
2.0000 | ORAL_TABLET | ORAL | Status: DC
Start: 2020-10-29 — End: 2020-10-31
  Administered 2020-10-30 – 2020-10-31 (×2): 2 via ORAL
  Filled 2020-10-29 (×2): qty 2

## 2020-10-29 MED ORDER — CEFAZOLIN SODIUM-DEXTROSE 2-4 GM/100ML-% IV SOLN
2.0000 g | Freq: Once | INTRAVENOUS | Status: AC
  Administered 2020-10-29: 2 g via INTRAVENOUS
  Filled 2020-10-29: qty 100

## 2020-10-29 MED ORDER — LACTATED RINGERS IV SOLN: 500.0000 mL | INTRAVENOUS | Status: DC | PRN

## 2020-10-29 MED ORDER — TRANEXAMIC ACID-NACL 1000-0.7 MG/100ML-% IV SOLN
INTRAVENOUS | Status: AC
  Administered 2020-10-29: 1000 mg
  Filled 2020-10-29: qty 100

## 2020-10-29 MED ORDER — MISOPROSTOL 200 MCG PO TABS
ORAL_TABLET | ORAL | Status: AC
  Administered 2020-10-29: 100 ug via VAGINAL
  Filled 2020-10-29: qty 5

## 2020-10-29 MED ORDER — LACTATED RINGERS IV SOLN: INTRAVENOUS | Status: DC

## 2020-10-29 MED ORDER — OXYTOCIN-SODIUM CHLORIDE 30-0.9 UT/500ML-% IV SOLN
INTRAVENOUS | Status: AC
  Administered 2020-10-29: 10 [IU]/h via INTRAVENOUS
  Filled 2020-10-29: qty 500

## 2020-10-29 MED ORDER — OXYTOCIN 10 UNIT/ML IJ SOLN: 10.0000 [IU] | Freq: Once | INTRAMUSCULAR | Status: AC

## 2020-10-29 MED ORDER — TETANUS-DIPHTH-ACELL PERTUSSIS 5-2.5-18.5 LF-MCG/0.5 IM SUSY: 0.5000 mL | PREFILLED_SYRINGE | Freq: Once | INTRAMUSCULAR | Status: DC

## 2020-10-29 MED ORDER — LACTATED RINGERS IV BOLUS
1000.0000 mL | Freq: Once | INTRAVENOUS | Status: AC
  Administered 2020-10-29: 1000 mL via INTRAVENOUS

## 2020-10-29 MED ORDER — MISOPROSTOL 100 MCG PO TABS: 100.0000 ug | ORAL_TABLET | Freq: Once | ORAL | Status: AC

## 2020-10-29 MED ORDER — ONDANSETRON HCL 4 MG/2ML IJ SOLN: 4.0000 mg | INTRAMUSCULAR | Status: DC | PRN

## 2020-10-29 MED ORDER — IBUPROFEN 600 MG PO TABS
600.0000 mg | ORAL_TABLET | Freq: Four times a day (QID) | ORAL | Status: DC
  Administered 2020-10-29 – 2020-10-31 (×9): 600 mg via ORAL
  Filled 2020-10-29 (×9): qty 1

## 2020-10-29 MED ORDER — BENZOCAINE-MENTHOL 20-0.5 % EX AERO: 1.0000 "application " | INHALATION_SPRAY | CUTANEOUS | Status: DC | PRN

## 2020-10-29 MED ORDER — CARBOPROST TROMETHAMINE 250 MCG/ML IM SOLN
INTRAMUSCULAR | Status: AC
  Filled 2020-10-29: qty 1

## 2020-10-29 MED ORDER — FENTANYL CITRATE (PF) 100 MCG/2ML IJ SOLN
INTRAMUSCULAR | Status: AC
  Filled 2020-10-29: qty 4

## 2020-10-29 NOTE — Progress Notes (Addendum)
Patient transferred to mother baby room 416. Patient was about to transferred to bed and she stated. " Oh boy, blood starting to gush". RN got patient in bed, and assessed. Patient had some moderate bleeding with medium sized clots. Fundal massage performed  Edythe Clarity, RN arrived to room, and assessed patient.  Clydie Braun, RN was informed about pateint bleeding status.Vital signs were taken and vigorous fundal massage performed. By Edythe Clarity, RN Moderate to large clots with bleeding persisted. Dr. Reina Fuse called to bedside. [facility] called.

## 2020-10-29 NOTE — MAU Note (Signed)
.  Hannah Ponce is a 25 y.o. at [redacted]w[redacted]d here in MAU reporting: water broke around 0530, with contrations that started about 2 hours ago Onset of complaint: 10/29/2020 Pain score: 10/10 Vitals:   10/29/20 0630 10/29/20 0633  BP: 123/83 124/86  Pulse: 82 94  Resp:    Temp:    SpO2:

## 2020-10-29 NOTE — Progress Notes (Addendum)
Patient transferred to mother baby room 416. This RN arrived to room and assessed patient along with Kerin Salen, RN. Vital signs were taken and vigorous fundal massage performed by this RN. Moderate to large clots with bleeding persisted. Dr. Reina Fuse called to bedside, who then started [facility] Code. Clots extracted along with fundal massage, 2 IV's and foley placement completed over approximately 30 minutes. Patient stabilized and received 1 hour of Q15 minute checks (all WDL). IV antibiotics ordered and given. Total QBL during [facility]=753mL.  Addendum: At 1345, bleeding scan, fundus firm at U/E, patient experiencing no pain, patient A+Ox3, assessment WDL.

## 2020-10-29 NOTE — Progress Notes (Signed)
I was called to patient bedside shortly after patient was brought up from MAU for concerns about increased vaginal bleeding. Reports note precipitous delivery, no IV access, received IM pitocin only. 3rd SVD, reportedly uncomplicated PNC. I performed two manual sweeps, the first time removing scant membrane with clots and the second with bright red clot totaling approx 450cc EBL. Faculty attending paged during this, CNM and resident presented to room. Patient's partner did note she would be amenable to receiving pRBC in event of life-threatening emergency. BP approx 120s/80s, HR 129, O2 98%. Intermittent LUS atony still noted, IM methergine given in LLE at 1003, 2g Ancef x1 ordered. During continued bimanual massage by CNM, IV access established, Foley catheter placed at 1006. IV pitocin initiated. At this time, Faculty attending [place] presented to room (was in OR prior to this). Patient intermittently nauseous at this time. Tone notably improved with establishment of IV pitocin. Given acuity of situation, I placed orders for CBC, CMP, ABO and Ancef. At this time, care fully transferred back to faculty team

## 2020-10-29 NOTE — H&P (Signed)
Hannah Ponce is a 25 y.o. female, [id number] at 54 weeks, presenting for active labor.  She reports she started contracting at 0400 and had SROM while driving to the hospital.  Patient receives care at [facility] and was supervised for a low risk pregnancy. Pregnancy and medical history significant for problems as listed below. She is GBS unknown.  She delivered an infant precipitously in the MAU shortly after arrival.  She requests BTL for PP birth control method and signed papers on September 27, 2020, but they were incorrect.    Patient Active Problem List   Diagnosis Date Noted   Unwanted fertility 09/27/2020   Palpitations 09/08/2020   SOB (shortness of breath) 09/08/2020   Dizziness 09/08/2020   Murmur, cardiac 09/08/2020   Medical history non-contributory 09/07/2020   LGSIL on Pap smear of cervix 04/27/2020   Supervision of low-risk first pregnancy, first trimester 04/24/2020   Arthralgia 04/14/2020    History of present pregnancy:  Last evaluation:  September 27, 2020 in office by K. Earlene Plater, MD   Nursing Staff Provider  Office Location [facility] Dating  LMP  Language  English Anatomy US  normal  Flu Vaccine  10/21 (work) Genetic Screen  NIPS: low risk  AFP:  Neg     TDaP Vaccine  08/15/20 Hgb A1C or  GTT Early  Third trimester - normal  COVID Vaccine Pfizer 10/08/19, 11/2220   LAB RESULTS   Rhogam  n/a Blood Type O/Positive/-- (02/21 1603)   Feeding Plan Breast Antibody Negative (02/21 1603)  Contraception BTL vs Nexplnon or Depo Rubella 7.51 (02/21 1603)  Circumcision Yes (if boy) RPR Non Reactive (02/21 1603)   Pediatrician  [facility] HBsAg Negative (02/21 1603)   Support Person Ferdie Ping (FOB) HCVAb neg  Prenatal Classes  HIV Non Reactive (02/21 1603)     BTL Consent needs GBS   (For PCN allergy, check sensitivities)   VBAC Consent NA Pap 04/24/20, LSIL>colpo PP    Hgb Electro    BP Cuff Given 04/24/20 CF     SMA     Waterbirth  [ ]  Class [ ]  Consent [ ]  CNM visit     Induction  [ ]  Orders Entered [ ] Foley Y/N     OB History     Gravida  4   Para  2   Term  2   Preterm  0   AB  1   Living  2      SAB  0   IAB  1   Ectopic  0   Multiple  0   Live Births  2             Past Medical History:  Diagnosis Date   Medical history non-contributory    Past Surgical History:  Procedure Laterality Date   NO PAST SURGERIES     Family History: family history includes Healthy in her mother. Social History:  reports that she has quit smoking. She has never used smokeless tobacco. She reports that she does not currently use alcohol. She reports that she does not currently use drugs.   Prenatal Transfer Tool  Maternal Diabetes: No Genetic Screening: Normal Maternal Ultrasounds/Referrals: Normal Fetal Ultrasounds or other Referrals:  None Maternal Substance Abuse:  No Significant Maternal Medications:  None Significant Maternal Lab Results: Other:    Maternal Assessment:  ROS: +Contractions, +LOF, -Vaginal Bleeding, +Fetal Movement  All other systems reviewed and negative.    Allergies  Allergen Reactions   Avocado  Itching and Rash     Dilation: 10 Effacement (%): 100 Station: Plus 2 Exam by:: Ezara Lipsocmb, RN Blood pressure 124/86, pulse 94, temperature 98.3 F (36.8 C), temperature source Oral, resp. rate 18, last menstrual period 01/30/2020, SpO2 100 %.  Physical Exam Constitutional:      General: She is in acute distress.     Appearance: Normal appearance.  HENT:     Head: Normocephalic and atraumatic.  Eyes:     Conjunctiva/sclera: Conjunctivae normal.  Cardiovascular:     Rate and Rhythm: Normal rate.  Pulmonary:     Effort: Pulmonary effort is normal. No respiratory distress.  Musculoskeletal:     Cervical back: Normal range of motion.  Skin:    General: Skin is warm and dry.  Neurological:     Mental Status: She is alert and oriented to person, place, and time.  Psychiatric:        Mood and  Affect: Mood normal.        Behavior: Behavior normal.        Thought Content: Thought content normal.    Fetal Assessment:  FHR: 120 by doppler UCs:  Palpates moderate    Assessment IUP at 39 weeks SVD GBS Unknown Desires BTL  Plan: Delivery of female infant occurred in MAU See delivery note for complete details. Admission and Transfer orders placed.  Informed that BTL papers were incorrect and unable to get immediate PPBTL. Discussed options for intrapartum birth control including depo, pills, or nexplanon. Patient encouraged to contact office to sign papers. Will complete recovery care and transfer to [facility], MSN 10/29/2020, 6:59 AM

## 2020-10-29 NOTE — Lactation Note (Addendum)
This note was copied from a baby's chart. Lactation Consultation Note  Patient Name: Hannah Ponce Date: 10/29/2020 Reason for consult: Initial assessment;Term;Mother's request;Difficult latch (Per mom, infant was not sustaining latch and easily falling asleep at the breast.) Age:27 hours Late [facility] consult at infant's 13 hours of life. LC entered the room, infant was cuing to breastfeed. 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  Mom latched infant on her left breast using the football hold position, LC asked mom to breastfeed infant skin to skin, infant latched with depth, swallows observed and infant was still breastfeeding after 12 minutes. LC dicussed with mom to do breast stimulation techniques to keep infant awake and alert while breastfeeding such as: skin to skin, breast compressions, gently stroking infant's neck and shoulder and talking to infant. LC discussed infant's input and output. Mom made aware of O/P services, breastfeeding support groups, community resources, and our phone # for post-discharge questions.   Mom's plan: 1- Breastfeed infant according to cues, 8 to 12+ or more times within 24 hours, skin to skin. 2- Ask RN for latch assistance tonight if needed.  Maternal Data Does the patient have breastfeeding experience prior to this delivery?: Yes How long did the patient breastfeed?: Per mom, she BF her 1st child for 3 months and would like to breastfeed her daughter longer  Feeding Mother's Current Feeding Choice: Breast Milk  LATCH Score Latch: Grasps breast easily, tongue down, lips flanged, rhythmical sucking.  Audible Swallowing: Spontaneous and intermittent  Type of Nipple: Everted at rest and after stimulation  Comfort (Breast/Nipple): Soft / non-tender  Hold (Positioning): Assistance needed to correctly position infant at breast and maintain latch.  LATCH Score: 9   Lactation Tools  Discussed/Used    Interventions Interventions: Breast feeding basics reviewed;Assisted with latch;Skin to skin;Breast massage;Hand express;Expressed milk;Position options;Support pillows;Adjust position;Breast compression  Discharge Pump: Personal WIC Program: No  Consult Status Consult Status: Follow-up Date: 10/30/20 Follow-up type: In-patient    Danelle Earthly 10/29/2020, 8:52 PM

## 2020-10-29 NOTE — Progress Notes (Addendum)
Called to [phone] for PPH. Patient precipitously delivered in MAU, received 1 dose IM pitocin as she did not have PIV in MAU. RN noticed bleeding while transferring to postpartum. Starting Hgb was 10.8, she had 725 cc blood loss from delivery.  On arriving to the room, Dr. Reina Fuse was present and doing a lower uterine sweep with improved tone and blood clots.  Initiated methergine IM, started 20g PIV, started 1L LR bolus. Dr. Charlotta Newton paged, coming from OR.  Patient was in and out of consciousness, uterine was atonic again. Thressa Sheller, CNM, performed second sweep with improved tone.   Obtained CBC, BMP, started pitocin and txa. Cytotec 1000mg  given rectally. Second PIV access started.   Patient had 3rd episode of uterine atony, did 3rd sweep, this time with much improved tone and no further bleeding. Patient with improved mentation, given 100 mcg fentanyl for pain. Dr. Thressa Sheller at bedside. Charlotta Newton present, but not needed as patient maintained tone.  BP improved 120s/80s, HR 130s.   Total blood loss: 1479cc Labs: CBC and CMP cooking Medications: Methergine IM, pitocin IV, cytotec 1000 mg rectal, Ancef 2g, TXA. Fentanyl 100 mcg for pain, zofran 4mg  for nausea. Lines: 2 20g PIV in L and R forearms, foley catheter placed  Will recheck patient in 2 hours.  Mel Almond, MD [facility] Family Medicine Residency, PGY-3  I was present with the resident and agree with the note above.   Shirlean Mylar DNP, CNM  10/29/20  12:12 PM

## 2020-10-30 ENCOUNTER — Encounter (HOSPITAL_COMMUNITY): Payer: Self-pay | Admitting: Obstetrics and Gynecology

## 2020-10-30 LAB — CBC
HCT: 20.9 % — ABNORMAL LOW (ref 36.0–46.0)
Hemoglobin: 7 g/dL — ABNORMAL LOW (ref 12.0–15.0)
MCH: 29 pg (ref 26.0–34.0)
MCHC: 33.5 g/dL (ref 30.0–36.0)
MCV: 86.7 fL (ref 80.0–100.0)
Platelets: 158 10*3/uL (ref 150–400)
RBC: 2.41 MIL/uL — ABNORMAL LOW (ref 3.87–5.11)
RDW: 12.3 % (ref 11.5–15.5)
WBC: 11.4 10*3/uL — ABNORMAL HIGH (ref 4.0–10.5)
nRBC: 0 % (ref 0.0–0.2)

## 2020-10-30 LAB — COMPREHENSIVE METABOLIC PANEL
ALT: 48 U/L — ABNORMAL HIGH (ref 0–44)
AST: 53 U/L — ABNORMAL HIGH (ref 15–41)
Albumin: 1.9 g/dL — ABNORMAL LOW (ref 3.5–5.0)
Alkaline Phosphatase: 78 U/L (ref 38–126)
Anion gap: 5 (ref 5–15)
BUN: 9 mg/dL (ref 6–20)
CO2: 24 mmol/L (ref 22–32)
Calcium: 8 mg/dL — ABNORMAL LOW (ref 8.9–10.3)
Chloride: 105 mmol/L (ref 98–111)
Creatinine, Ser: 0.79 mg/dL (ref 0.44–1.00)
GFR, Estimated: >60 mL/min (ref >60)
Glucose, Bld: 92 mg/dL (ref 70–99)
Potassium: 3.9 mmol/L (ref 3.5–5.1)
Sodium: 134 mmol/L — ABNORMAL LOW (ref 135–145)
Total Bilirubin: 0.3 mg/dL (ref 0.3–1.2)
Total Protein: 4.9 g/dL — ABNORMAL LOW (ref 6.5–8.1)

## 2020-10-30 LAB — HEMOGLOBIN AND HEMATOCRIT, BLOOD
HCT: 25.4 % — ABNORMAL LOW (ref 36.0–46.0)
Hemoglobin: 8.3 g/dL — ABNORMAL LOW (ref 12.0–15.0)

## 2020-10-30 LAB — PREPARE RBC (CROSSMATCH)

## 2020-10-30 MED ORDER — SODIUM CHLORIDE 0.9% IV SOLUTION: Freq: Once | INTRAVENOUS | Status: AC

## 2020-10-30 MED ORDER — SODIUM CHLORIDE 0.9 % IV SOLN
500.0000 mg | Freq: Once | INTRAVENOUS | Status: DC
  Filled 2020-10-30: qty 25

## 2020-10-30 NOTE — Progress Notes (Signed)
POSTPARTUM PROGRESS NOTE  Post Partum Day 1  Subjective:  Hannah Ponce is a 24 y.o. [phone] s/p SVD in the MAU at [redacted]w[redacted]d. Postpartum course already significant for PPH several hours following delivery. She reports this morning she is doing well overall, however is feeling a little lightheaded/dizzy with standing, denies SOB and fatigue. No acute events overnight. She denies any problems with ambulating, voiding or po intake. Denies nausea or vomiting.  Pain is well controlled.  Lochia is mild.  Objective: Blood pressure 106/64, pulse 82, temperature 98.4 F (36.9 C), temperature source Oral, resp. rate 16, last menstrual period 01/30/2020, SpO2 99 %.  Physical Exam:  General: alert, cooperative and no distress Chest: no respiratory distress Heart:regular rate, distal pulses intact Uterine Fundus: firm, appropriately tender DVT Evaluation: No calf swelling or tenderness Extremities: no edema Skin: warm, dry  Recent Labs    10/29/20 1017 10/30/20 0435  HGB 10.0* 7.0*  HCT 30.4* 20.9*    Assessment/Plan: Hannah Ponce is a 25 y.o. [id number] s/p SVD at [redacted]w[redacted]d   PPD#1 - Doing well overall   Routine postpartum care  Acute blood loss anemia: Hgb 7.0 from 10.0 yesterday am prior to delivery. Pt discussed RBC transfusion risks and benefits with Dr. Leary Roca, will proceed with one unit. Lochia currently appropriate.   Mild Transaminitis: AST/ALT 53/48, from 79/63 on admit. No symptoms and not associated with pre-e s/sx (BP, plts and cr wnl), however will closely monitor BP (slight elevation during delivery). Will monitor, recheck tomorrow am. If still elevated, consider hepatitis panel.   Contraception: Planning on interval BTL, considering depo bridge   Feeding: Breast Dispo: Plan for discharge tomorrow.  Seen in conjunction with Dr. Leary Roca, [facility] resident PGY-3.   LOS: 1 day   Leticia Penna, DO  OB Fellow  10/30/2020, 7:32 AM

## 2020-10-30 NOTE — Lactation Note (Signed)
This note was copied from a baby's chart. Lactation Consultation Note  Patient Name: Hannah Ponce TDHRC'B Date: 10/30/2020 Reason for consult: Follow-up assessment;Mother's request Age:27 hours  Mom experienced with breastfeeding. She has infant latched on LC arrival and denies any pain with the latch.  Mom [employer] employee and will call in am when husband returns with her insurance card to get her pump.   Mom did not require any assistance with breastfeeding at the time of LC visit.   Maternal Data    Feeding Mother's Current Feeding Choice: Breast Milk  LATCH Score                    Lactation Tools Discussed/Used    Interventions Interventions: Breast feeding basics reviewed;Breast compression;Education  Discharge Pump: Employee Pump (Mom selected the freestyle pump but her husband took her car keys that are attached to her wallet and will not be back until tomorrow. Mom to alert staff when she has her insurance card so she can get her DEBP)  Consult Status Consult Status: Follow-up Date: 10/31/20 Follow-up type: In-patient    Karsten Vaughn  Nicholson-Springer 10/30/2020, 2:22 PM

## 2020-10-31 DIAGNOSIS — O9903 Anemia complicating the puerperium: Secondary | ICD-10-CM | POA: Diagnosis not present

## 2020-10-31 LAB — COMPREHENSIVE METABOLIC PANEL
ALT: 45 U/L — ABNORMAL HIGH (ref 0–44)
AST: 47 U/L — ABNORMAL HIGH (ref 15–41)
Albumin: 2.1 g/dL — ABNORMAL LOW (ref 3.5–5.0)
Alkaline Phosphatase: 71 U/L (ref 38–126)
Anion gap: 5 (ref 5–15)
BUN: 9 mg/dL (ref 6–20)
CO2: 24 mmol/L (ref 22–32)
Calcium: 8.3 mg/dL — ABNORMAL LOW (ref 8.9–10.3)
Chloride: 107 mmol/L (ref 98–111)
Creatinine, Ser: 0.59 mg/dL (ref 0.44–1.00)
GFR, Estimated: >60 mL/min (ref >60)
Glucose, Bld: 77 mg/dL (ref 70–99)
Potassium: 4.1 mmol/L (ref 3.5–5.1)
Sodium: 136 mmol/L (ref 135–145)
Total Bilirubin: 0.5 mg/dL (ref 0.3–1.2)
Total Protein: 5.2 g/dL — ABNORMAL LOW (ref 6.5–8.1)

## 2020-10-31 LAB — BPAM RBC
Blood Product Expiration Date: 202209292359
ISSUE DATE / TIME: 202208291127
Unit Type and Rh: 5100

## 2020-10-31 LAB — TYPE AND SCREEN
ABO/RH(D): O POS
Antibody Screen: NEGATIVE
Unit division: 0

## 2020-10-31 MED ORDER — MEDROXYPROGESTERONE ACETATE 150 MG/ML IM SUSP
150.0000 mg | Freq: Once | INTRAMUSCULAR | Status: AC
  Administered 2020-10-31: 150 mg via INTRAMUSCULAR
  Filled 2020-10-31: qty 1

## 2020-10-31 MED ORDER — IBUPROFEN 600 MG PO TABS: 600.0000 mg | ORAL_TABLET | Freq: Four times a day (QID) | ORAL | 0 refills | Status: DC | PRN

## 2020-10-31 NOTE — Discharge Summary (Signed)
Postpartum Discharge Summary     Patient Name: Hannah Ponce DOB: Sep 02, 1995 MRN: [id number]  Date of admission: 10/29/2020 Delivery date:10/29/2020  Delivering provider: Gavin Pound  Date of discharge: 10/31/2020  Admitting diagnosis: Vaginal delivery [O80] Intrauterine pregnancy: [redacted]w[redacted]d    Secondary diagnosis:  Active Problems:   LGSIL on Pap smear of cervix   Vaginal delivery   Postpartum hemorrhage   Anemia of mother, with delivery, with postpartum complication  Additional problems: GBS unknown    Discharge diagnosis: Term Pregnancy Delivered                                              Post partum procedures:blood transfusion x 1uPRBC Augmentation:  none Complications: [id number] Hospital course: Onset of Labor With Vaginal Delivery      25y.o. yo [id number] admitted in Active Labor on 10/29/2020. Patient had an uncomplicated, precipitous labor course and delivered in the MAU as follows:  Membrane Rupture Time/Date: 4:00 AM ,10/29/2020   Delivery Method:Vaginal, Spontaneous  Episiotomy: None  Lacerations:  Perineal  Patient had a postpartum course complicated by a PPH of [id number] transferring to her MBU room (see documentation of events). By PPD#1 her Hgb was 7.0 from 10.0 on admission- she was consented for a blood tx of 1uPRBC which she tolerated well. Of note, her LFTs were also slightly elevated but decreasing upon discharge (79/63 to 47/45) in the setting of nl BPs. She is ambulating, tolerating a regular diet, passing flatus, and urinating well. She had desired a ppBTL however the 30 day papers were not signed correctly and so a Depo bridge is being offered. Patient is discharged home in stable condition on 10/31/20.  Newborn Data: Birth date:10/29/2020  Birth time:6:12 AM  Gender:Female  Living status:Living  Apgars:9 ,9  Weight:3141 g (6lb 14.8oz)  Magnesium Sulfate received: No BMZ received: No Rhophylac:N/A MMR:N/A T-DaP:Given  prenatally Flu: N/A Transfusion:Yes (1u PRBC)  Physical exam  Vitals:   10/30/20 1148 10/30/20 1300 10/30/20 2300 10/31/20 0536  BP: 113/77 107/68 111/64 114/70  Pulse: 87 74 79 77  Resp: '16 16 18 16  ' Temp: 98.3 F (36.8 C) 98.1 F (36.7 C) 98.4 F (36.9 C) 98 F (36.7 C)  TempSrc: Oral Oral Oral Oral  SpO2: 100% 100% 99% 100%   General: alert and cooperative Lochia: appropriate Uterine Fundus: firm Incision: N/A DVT Evaluation: No evidence of DVT seen on physical exam. Labs: Lab Results  Component Value Date   WBC 11.4 (H) 10/30/2020   HGB 8.3 (L) 10/30/2020   HCT 25.4 (L) 10/30/2020   MCV 86.7 10/30/2020   PLT 158 10/30/2020   CMP Latest Ref Rng & Units 10/31/2020  Glucose 70 - 99 mg/dL 77  BUN 6 - 20 mg/dL 9  Creatinine 0.44 - 1.00 mg/dL 0.59  Sodium 135 - 145 mmol/L 136  Potassium 3.5 - 5.1 mmol/L 4.1  Chloride 98 - 111 mmol/L 107  CO2 22 - 32 mmol/L 24  Calcium 8.9 - 10.3 mg/dL 8.3(L)  Total Protein 6.5 - 8.1 g/dL 5.2(L)  Total Bilirubin 0.3 - 1.2 mg/dL 0.5  Alkaline Phos 38 - 126 U/L 71  AST 15 - 41 U/L 47(H)  ALT 0 - 44 U/L 45(H)   EdFlavia Shippercore: Edinburgh Postnatal Depression Scale Screening Tool 10/31/2020  I have been able to laugh  and see the funny side of things. 0  I have looked forward with enjoyment to things. 0  I have blamed myself unnecessarily when things went wrong. 3  I have been anxious or worried for no good reason. 3  I have felt scared or panicky for no good reason. 1  Things have been getting on top of me. 0  I have been so unhappy that I have had difficulty sleeping. 0  I have felt sad or miserable. 1  I have been so unhappy that I have been crying. 1  The thought of harming myself has occurred to me. 0  Edinburgh Postnatal Depression Scale Total 9     After visit meds:  Allergies as of 10/31/2020       Reactions   Avocado Itching, Rash        Medication List     TAKE these medications    B-complex with vitamin C  tablet Take 1 tablet by mouth daily. Take with food   ferrous sulfate 325 (65 FE) MG tablet Take 1 tablet (325 mg total) by mouth daily with breakfast.   ibuprofen 600 MG tablet Commonly known as: ADVIL Take 1 tablet (600 mg total) by mouth every 6 (six) hours as needed.   PRENATAL GUMMIES PO Take by mouth.   propranolol 10 MG tablet Commonly known as: INDERAL Take 1 tablet (10 mg total) by mouth as needed (At night if heart rate is higher then 150 beats per minute).         Discharge home in stable condition Infant Feeding: Breast Infant Disposition:home with mother Discharge instruction: per After Visit Summary and Postpartum booklet. Activity: Advance as tolerated. Pelvic rest for 6 weeks.  Diet: routine diet Future Appointments: Future Appointments  Date Time Provider [place]  11/22/2020  3:30 PM Gladys Damme, MD [facility]  12/04/2020  2:35 PM Radene Gunning, MD [facility]   Follow up Visit:  Follow-up Information     Gladys Damme, MD Follow up on 11/22/2020.   Specialty: Family Medicine Why: Please arrive 15 minutes early for a 3:30 PM appointment Contact information: [address] [place] [address] [phone]                 10/31/2020 Myrtis Ser, CNM 12:07 PM

## 2020-10-31 NOTE — Social Work (Signed)
CSW received and acknowledges consult for EDPS of 9.  Consult screened out due to 9 on EDPS does not warrant a CSW consult.  MOB whom scores are greater than 9/yes to question 10 on Edinburgh Postpartum Depression Screen warrants a CSW consult.   Bonham Zingale, MSW, LCSWA Clinical Social Work [facility] [phone] 

## 2020-10-31 NOTE — Lactation Note (Signed)
This note was copied from a baby's chart. Lactation Consultation Note  Patient Name: Hannah Ponce CHYIF'O Date: 10/31/2020 Reason for consult: Follow-up assessment;Term Age:27 hours  LC in to visit with P3 Mom of term infant on day of discharge.  Baby is at 4% weight loss and has been exclusively breastfeeding.    Mom denies needing any assistance with latching.  Reviewed basics.   Encouraged STS and watching for feeding cues, offering breast often.   Engorgement prevention and treatment reviewed.    Encouraged Mom to call prn and she is aware of OP lactation support available.    Interventions Interventions: Breast feeding basics reviewed;Skin to skin;Hand express;Breast massage;Education  Discharge Discharge Education: Engorgement and breast care Pump: Personal (Mom does not have [employer] insurance)  Consult Status Consult Status: Complete Date: 10/31/20 Follow-up type: In-patient    Judee Clara 10/31/2020, 12:22 PM

## 2020-11-05 ENCOUNTER — Inpatient Hospital Stay (HOSPITAL_COMMUNITY): Admit: 2020-11-05 | Payer: Self-pay

## 2020-11-07 ENCOUNTER — Telehealth (HOSPITAL_COMMUNITY): Payer: Self-pay | Admitting: Cardiology

## 2020-11-07 NOTE — Telephone Encounter (Signed)
Just an FYI. We have made several attempts to contact this patient including sending a letter to schedule or reschedule their echocardiogram. We will be removing the patient from the echo WQ.   10/24/20 NO SHOWED -MAILED LETTER [facility] and sent in basket to Dr. Janae Bridgeman      Thank you

## 2020-11-09 ENCOUNTER — Telehealth (HOSPITAL_COMMUNITY): Payer: Self-pay | Admitting: *Deleted

## 2020-11-09 NOTE — Telephone Encounter (Signed)
Attempted Hospital Discharge Follow-Up Call.  Left voice mail requesting that patient return RN's phone call.  

## 2020-11-22 ENCOUNTER — Inpatient Hospital Stay: Payer: Medicaid Other | Admitting: Family Medicine

## 2020-11-22 NOTE — Progress Notes (Deleted)
    SUBJECTIVE:   CHIEF COMPLAINT / HPI:   PPH:   PERTINENT  PMH / PSH: ***  OBJECTIVE:   LMP 01/30/2020   ***  ASSESSMENT/PLAN:   No problem-specific Assessment & Plan notes found for this encounter.     Shirlean Mylar, MD [facility]

## 2020-11-29 ENCOUNTER — Telehealth: Payer: Self-pay

## 2020-11-29 NOTE — Telephone Encounter (Signed)
Patient missed PP visit and is worried that she will be out of there time frame to have her tubed tide... no sooner appt available and is wanting to know what she needs to do and if she will be able to get this done at her 10/4 appt. She stated that something was wrong with the paper work and is not sure who she needs to speak with about this     Please call and advise

## 2020-11-29 NOTE — Telephone Encounter (Signed)
Called pt; VM left notifying patient I am returning her call. Pt encouraged to call back to talk about questions.

## 2020-12-01 NOTE — Progress Notes (Deleted)
SVD in MAU on 8/28, no lac PPH - due to atony, total ebl 1479 HgB 7.0 - 1u PRBC Interval salpingectomy with depo bridge? Tubal papers - signed 7.27.2022

## 2020-12-04 ENCOUNTER — Ambulatory Visit: Payer: Self-pay | Admitting: Obstetrics and Gynecology

## 2020-12-04 ENCOUNTER — Ambulatory Visit: Payer: Medicaid Other | Admitting: Obstetrics and Gynecology

## 2021-01-04 ENCOUNTER — Ambulatory Visit (INDEPENDENT_AMBULATORY_CARE_PROVIDER_SITE_OTHER): Payer: Medicaid Other | Admitting: Obstetrics & Gynecology

## 2021-01-04 ENCOUNTER — Encounter: Payer: Self-pay | Admitting: Obstetrics & Gynecology

## 2021-01-04 ENCOUNTER — Other Ambulatory Visit: Payer: Self-pay

## 2021-01-04 ENCOUNTER — Encounter: Payer: Self-pay | Admitting: Clinical

## 2021-01-04 VITALS — BP 125/83 | HR 102 | Wt 110.0 lb

## 2021-01-04 DIAGNOSIS — F419 Anxiety disorder, unspecified: Secondary | ICD-10-CM | POA: Diagnosis not present

## 2021-01-04 DIAGNOSIS — F53 Postpartum depression: Secondary | ICD-10-CM

## 2021-01-04 MED ORDER — SERTRALINE HCL 50 MG PO TABS: 50.0000 mg | ORAL_TABLET | Freq: Every day | ORAL | 2 refills | Status: AC

## 2021-01-04 NOTE — Progress Notes (Signed)
Subjective:     Hannah Ponce is a 25 y.o. female who presents for a postpartum visit. She is 9 weeks postpartum following a spontaneous vaginal delivery. I have fully reviewed the prenatal and intrapartum course. The delivery was at 39 gestational weeks. Outcome: spontaneous vaginal delivery. Anesthesia: none. Postpartum course has been complicated by some pelvic pressure. Baby's course has been good. Baby is feeding by breast. Bleeding no bleeding. Bowel function is normal. Bladder function is normal. Patient is sexually active. Contraception method is Depo-Provera injections. Postpartum depression screening: positive  The following portions of the patient's history were reviewed and updated as appropriate: allergies, current medications, past family history, past medical history, past social history, past surgical history, and problem list.  Review of Systems Behavioral/Psych: positive for depression   Objective:    BP 125/83   Pulse (!) 102   Wt 110 lb (49.9 kg)   BMI 19.49 kg/m   General:  alert, cooperative, and no distress   Breasts:    Lungs:   Heart:    Abdomen: soft, non-tender; bowel sounds normal; no masses,  no organomegaly   Vulva:  not evaluated                       Assessment:    9 week postpartum exam. Pap smear not done at today's visit.   Plan:    1. Contraception: Depo-Provera injections and tubal ligation 2. Will schedule BTL. Orders Placed This Encounter  Procedures   Ambulatory referral to Integrated Behavioral Health    Referral Priority:   Routine    Referral Type:   Consultation    Referral Reason:   Specialty Services Required    Number of Visits Requested:   1    3. Follow up as needed.   Adam Phenix, MD 01/04/2021

## 2021-01-05 NOTE — Progress Notes (Signed)
Intern went in to speak with the patient for about 15 minutes after being alerted by the nurse. Patient agreed to schedule an appointment with Hannah Ponce.

## 2021-01-05 NOTE — BH Specialist Note (Signed)
Integrated Behavioral Health via Telemedicine Visit  01/05/2021 ASHLEYANN SHOUN [id number]  Number of Integrated Behavioral Health visits: 1 Session Start time: 8:15  Session End time: 9:10 Total time: 12   Referring Provider: Scheryl Darter, MD Patient/Family location: Home [facility] Provider location: [facility] for [facility] at [facility]  All persons participating in visit: Patient Lisa Milian and [facility] Colleen Kotlarz   Types of Service: Individual psychotherapy and Video visit  I connected with Jamelle Rushing and/or Lysle Morales Gahan's  n/a  via  Telephone or Video Enabled Telemedicine Application  (Video is Caregility application) and verified that I am speaking with the correct person using two identifiers. Discussed confidentiality: Yes   I discussed the limitations of telemedicine and the availability of in person appointments.  Discussed there is a possibility of technology failure and discussed alternative modes of communication if that failure occurs.  I discussed that engaging in this telemedicine visit, they consent to the provision of behavioral healthcare and the services will be billed under their insurance.  Patient and/or legal guardian expressed understanding and consented to Telemedicine visit: Yes   Presenting Concerns: Patient and/or family reports the following symptoms/concerns: Increase in depression,anxiety, fatigue, worry postpartum, escalated with life stress (pt and FOB lost jobs at same time prior to birth) and sleep deprivation; pt open to implementing self-coping strategies.  Duration of problem: Increase postpartum; Severity of problem: moderate  Patient and/or Family's Strengths/Protective Factors: Social connections, Concrete supports in place (healthy food, safe environments, etc.), and Sense of purpose  Goals Addressed: Patient will:  Reduce symptoms of: anxiety, depression, insomnia, and stress   Increase knowledge and/or  ability of: coping skills and healthy habits   Demonstrate ability to: Increase healthy adjustment to current life circumstances and Increase adequate support systems for patient/family  Progress towards Goals: Ongoing  Interventions: Interventions utilized:  Mindfulness or Management consultant, Psychoeducation and/or Health Education, and Link to [employer] Standardized Assessments completed: GAD-7 and PHQ 9  Patient and/or Family Response: Pt agrees with treatment plan  Assessment: Patient currently experiencing Adjustment disorder with mixed anxious and depressed mood and Psychosocial stress.   Patient may benefit from psychoeducation and brief therapeutic interventions regarding coping with symptoms of anxiety, depression, life stress .  Plan: Follow up with behavioral health clinician on : Two weeks Behavioral recommendations:  -Continue taking Zoloft as prescribed -CALM relaxation breathing exercise twice daily (morning; at bedtime with sleep sounds) -Practice Worry Time strategy daily for two weeks -Consider online support group for new moms at www.postpartum.net Referral(s): Integrated Art gallery manager (In Clinic) and [employer]:  new mom support  I discussed the assessment and treatment plan with the patient and/or parent/guardian. They were provided an opportunity to ask questions and all were answered. They agreed with the plan and demonstrated an understanding of the instructions.   They were advised to call back or seek an in-person evaluation if the symptoms worsen or if the condition fails to improve as anticipated.  Rae Lips, LCSW  Depression screen [facility] 2/9 01/10/2021 09/12/2020 08/29/2020 08/15/2020 08/14/2020  Decreased Interest 1 1 1 3  0  Down, Depressed, Hopeless 0 0 1 0 0  PHQ - 2 Score 1 1 2 3  0  Altered sleeping 3 3 3 3 3   Tired, decreased energy 3 3 3 3 3   Change in appetite 3 3 3 2 3   Feeling bad or failure about yourself   1 0 0 0 0  Trouble concentrating  3 1 1  0 1  Moving slowly or fidgety/restless 0 0 0 0 0  Suicidal thoughts 0 0 0 0 0  PHQ-9 Score 14 11 12 11 10    GAD 7 : Generalized Anxiety Score 01/10/2021 09/12/2020 08/29/2020 08/15/2020  Nervous, Anxious, on Edge 1 3 2 2   Control/stop worrying 3 0 3 0  Worry too much - different things 3 0 2 1  Trouble relaxing 3 2 2 1   Restless 1 1 0 1  Easily annoyed or irritable 1 3 3 2   Afraid - awful might happen 1 2 2  0  Total GAD 7 Score 13 11 14  7

## 2021-01-10 ENCOUNTER — Ambulatory Visit (INDEPENDENT_AMBULATORY_CARE_PROVIDER_SITE_OTHER): Payer: Medicaid Other | Admitting: Clinical

## 2021-01-10 DIAGNOSIS — F4323 Adjustment disorder with mixed anxiety and depressed mood: Secondary | ICD-10-CM

## 2021-01-10 DIAGNOSIS — Z658 Other specified problems related to psychosocial circumstances: Secondary | ICD-10-CM

## 2021-01-10 NOTE — Patient Instructions (Addendum)
Center for [facility] Healthcare at [facility] [address] [place], [place] [address] [phone] (main office) [phone] (Laruth Hanger's office)  www.[email or web address].net (new parent online support groups)  /Emotional Producer, television/film/video and Websites Here are a few free apps meant to help you to help yourself.  To find, try searching on the internet to see if the app is offered on Apple/Android devices. If your first choice doesn't come up on your device, the good news is that there are many choices! Play around with different apps to see which ones are helpful to you.    Calm This is an app meant to help increase calm feelings. Includes info, strategies, and tools for tracking your feelings.      Calm Harm  This app is meant to help with self-harm. Provides many 5-minute or 15-min coping strategies for doing instead of hurting yourself.       Healthy Minds Health Minds is a problem-solving tool to help deal with emotions and cope with stress you encounter wherever you are.      MindShift This app can help people cope with anxiety. Rather than trying to avoid anxiety, you can make an important shift and face it.      MY3  MY3 features a support system, safety plan and resources with the goal of offering a tool to use in a time of need.       My Life My Voice  This mood journal offers a simple solution for tracking your thoughts, feelings and moods. Animated emoticons can help identify your mood.       Relax Melodies Designed to help with sleep, on this app you can mix sounds and meditations for relaxation.      Smiling Mind Smiling Mind is meditation made easy: it's a simple tool that helps put a smile on your mind.        Stop, Breathe & Think  A friendly, simple guide for people through meditations for mindfulness and compassion.  Stop, Breathe and Think Kids Enter your current feelings and choose a "mission" to help you cope. Offers videos for  certain moods instead of just sound recordings.       Team Orange The goal of this tool is to help teens change how they think, act, and react. This app helps you focus on your own good feelings and experiences.      The [employer] Box The [employer] Box (VHB) contains simple tools to help patients with coping, relaxation, distraction, and positive thinking.

## 2021-01-12 ENCOUNTER — Telehealth: Payer: Self-pay

## 2021-01-12 NOTE — Telephone Encounter (Signed)
Called patient to discuss potential surgery date, no answer, left voicemail

## 2021-01-15 ENCOUNTER — Encounter: Payer: Self-pay | Admitting: *Deleted

## 2021-01-15 NOTE — BH Specialist Note (Signed)
Pt did not arrive to video visit and did not answer the phone; Left HIPPA-compliant message to call back Gevorg Brum from [facility] at  [phone] (Ambriel Gorelick's office).  ?; left MyChart message for patient.  ? ?

## 2021-01-22 ENCOUNTER — Encounter: Payer: Medicaid Other | Admitting: Obstetrics and Gynecology

## 2021-01-24 ENCOUNTER — Ambulatory Visit: Payer: Medicaid Other | Admitting: Clinical

## 2021-01-24 DIAGNOSIS — Z91199 Patient's noncompliance with other medical treatment and regimen due to unspecified reason: Secondary | ICD-10-CM

## 2021-01-31 ENCOUNTER — Telehealth: Payer: Self-pay | Admitting: Family Medicine

## 2021-01-31 NOTE — Telephone Encounter (Signed)
Patient need a refill on Zoloft.

## 2021-01-31 NOTE — Telephone Encounter (Addendum)
Patient left a voicemail about needed refill of zoloft. Per chart a prescription was sent in 01/04/21 for 30 tablets with 2 refills to [facility] [address]. I called Marquelle and left a message I am returning her call and since I did not reach her , I will send a detailed MyChart message. If you have questions after you read your message or can't receive message- please call office.  I also called [facility] but heard message closed for lunch.  Tyreek Clabo,RN 3:25 I called [facility] and confirmed Jonnell did pick up zoloft and has a refill ready for pick up now. I called Danasha and left a voicemail I am calling back to inform her that her  rx is ready for pick up. Bryona Foxworthy,RN

## 2021-02-15 ENCOUNTER — Telehealth: Payer: Medicaid Other | Admitting: Physician Assistant

## 2021-02-15 DIAGNOSIS — N946 Dysmenorrhea, unspecified: Secondary | ICD-10-CM

## 2021-02-15 DIAGNOSIS — N921 Excessive and frequent menstruation with irregular cycle: Secondary | ICD-10-CM

## 2021-02-15 NOTE — Progress Notes (Signed)
Virtual Visit Consent   [name], you are scheduled for a virtual visit with a [facility] provider today.     Just as with appointments in the office, your consent must be obtained to participate.  Your consent will be active for this visit and any virtual visit you may have with one of our providers in the next 365 days.     If you have a MyChart account, a copy of this consent can be sent to you electronically.  All virtual visits are billed to your insurance company just like a traditional visit in the office.    As this is a virtual visit, video technology does not allow for your provider to perform a traditional examination.  This may limit your provider's ability to fully assess your condition.  If your provider identifies any concerns that need to be evaluated in person or the need to arrange testing (such as labs, EKG, etc.), we will make arrangements to do so.     Although advances in technology are sophisticated, we cannot ensure that it will always work on either your end or our end.  If the connection with a video visit is poor, the visit may have to be switched to a telephone visit.  With either a video or telephone visit, we are not always able to ensure that we have a secure connection.     I need to obtain your verbal consent now.   Are you willing to proceed with your visit today?    Hannah Ponce has provided verbal consent on 02/15/2021 for a virtual visit (video or telephone).   Margaretann Loveless, PA-C   Date: 02/15/2021 2:07 PM   Virtual Visit via Video Note   I, Margaretann Loveless, connected with  Hannah Ponce  ([id number], 1995-05-14) on 02/15/21 at  1:30 PM EST by a video-enabled telemedicine application and verified that I am speaking with the correct person using two identifiers.  Location: Patient: Virtual Visit Location Patient: Home Provider: Virtual Visit Location Provider: Home Office   I discussed the limitations of evaluation and management  by telemedicine and the availability of in person appointments. The patient expressed understanding and agreed to proceed.    History of Present Illness: Hannah Ponce is a 25 y.o. who identifies as a female who was assigned female at birth, and is being seen today for menorrhagia. She reports this is her second cycle since giving birth 4 months ago. The first cycle seemed normal. This cycle started 2 days ago. Was a little heavier than the first, but today she is passing large clots and going through pads quickly. She does also report that since her pregnancy she has felt a deep pain at the area of her uterus. Sometimes is severe and limiting. She does have a history of hemorrhaging requiring blood transfusion after the birth 4 months ago.    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 Take with  food (Patient not taking: Reported on 01/04/2021), Disp: 30 tablet, Rfl: 2   ferrous sulfate 325 (65 FE) MG tablet, Take 1 tablet (325 mg total) by mouth daily with breakfast. (Patient not taking: Reported on 01/04/2021), Disp: 90 tablet, Rfl: 1   ibuprofen (ADVIL) 600 MG tablet, Take 1 tablet (600 mg total) by mouth every 6 (six) hours as needed. (Patient not taking: Reported on 01/04/2021), Disp: 30 tablet, Rfl: 0   Prenatal MV & Min w/FA-DHA (PRENATAL GUMMIES PO), Take by mouth. (Patient not taking: Reported on 01/04/2021), Disp: , Rfl:    propranolol (INDERAL) 10 MG tablet, Take 1 tablet  (10 mg total) by mouth as needed (At night if heart rate is higher then 150 beats per minute). (Patient not taking: Reported on 01/04/2021), Disp: 45 tablet, Rfl: 3   sertraline (ZOLOFT) 50 MG tablet, Take 1 tablet (50 mg total) by mouth daily., Disp: 30 tablet, Rfl: 2  Observations/Objective: Patient is well-developed, well-nourished in no acute distress.  Resting comfortably at home.  Head is normocephalic, atraumatic.  No labored breathing.  Speech is clear and coherent with logical content.  Patient is alert and oriented at baseline.    Assessment and Plan: 1. Menorrhagia with irregular cycle  - Since patient having significant bleeding with large clots and h/o hemorrhaging requiring transfusion, I have advised her to seek in person evaluation with OB or ER.  Follow Up Instructions: I discussed the assessment and treatment plan with the patient. The patient was provided an opportunity to ask questions and all were answered. The patient agreed with the plan and demonstrated an understanding of the instructions.  A copy of instructions were sent to the patient via MyChart unless otherwise noted below.    The patient was advised to call back or seek an in-person evaluation if the symptoms worsen or if the condition fails to improve as anticipated.  Time:  I spent 10 minutes with the patient via telehealth technology discussing the above problems/concerns.    Margaretann Loveless, PA-C

## 2021-02-15 NOTE — Progress Notes (Signed)
Duplicate encounter

## 2021-02-15 NOTE — Patient Instructions (Signed)
Hannah Ponce, thank you for joining Margaretann Loveless, PA-C for today's virtual visit.  While this provider is not your primary care provider (PCP), if your PCP is located in our provider database this encounter information will be shared with them immediately following your visit.  Consent: (Patient) Hannah Ponce provided verbal consent for this virtual visit at the beginning of the encounter.  Current Medications:  Current Outpatient Medications:    B Complex-C (B-COMPLEX WITH VITAMIN C) tablet, Take 1 tablet by mouth daily. Take with food (Patient not taking: Reported on 01/04/2021), Disp: 30 tablet, Rfl: 2   ferrous sulfate 325 (65 FE) MG tablet, Take 1 tablet (325 mg total) by mouth daily with breakfast. (Patient not taking: Reported on 01/04/2021), Disp: 90 tablet, Rfl: 1   ibuprofen (ADVIL) 600 MG tablet, Take 1 tablet (600 mg total) by mouth every 6 (six) hours as needed. (Patient not taking: Reported on 01/04/2021), Disp: 30 tablet, Rfl: 0   Prenatal MV & Min w/FA-DHA (PRENATAL GUMMIES PO), Take by mouth. (Patient not taking: Reported on 01/04/2021), Disp: , Rfl:    propranolol (INDERAL) 10 MG tablet, Take 1 tablet (10 mg total) by mouth as needed (At night if heart rate is higher then 150 beats per minute). (Patient not taking: Reported on 01/04/2021), Disp: 45 tablet, Rfl: 3   sertraline (ZOLOFT) 50 MG tablet, Take 1 tablet (50 mg total) by mouth daily., Disp: 30 tablet, Rfl: 2   Medications ordered in this encounter:  No orders of the defined types were placed in this encounter.    *If you need refills on other medications prior to your next appointment, please contact your pharmacy*  Follow-Up: Call back or seek an in-person evaluation if the symptoms worsen or if the condition fails to improve as anticipated.  Other Instructions Menorrhagia Menorrhagia is a form of abnormal uterine bleeding in which menstrual periods are heavy or last longer than normal. With menorrhagia,  the periods may cause enough blood loss and cramping that a woman becomes unable to take part in her usual activities. What are the causes? Common causes of this condition include: Polyps or fibroids. These are noncancerous growths in the uterus. An imbalance of the hormones estrogen and progesterone. Anovulation, which occurs when one of the ovaries does not release an egg during one or more months. A problem with the thyroid gland (hypothyroidism). Side effects of having an intrauterine device (IUD). Side effects of some medicines, such as NSAIDs or blood thinners. A bleeding disorder that stops the blood from clotting normally. In some cases, the cause of this condition is not known. What increases the risk? You are more likely to develop this condition if you have cancer of the uterus. What are the signs or symptoms? Symptoms of this condition include: Routinely having to change your pad or tampon every 1-2 hours because it is soaked. Needing to use pads and tampons at the same time because of heavy bleeding. Needing to wake up to change your pads or tampons during the night. Passing blood clots larger than 1 inch (2.5 cm) in size. Having bleeding that lasts for more than 7 days. Having symptoms of low iron levels (anemia), such as tiredness (fatigue) or shortness of breath. How is this diagnosed? This condition may be diagnosed based on: A physical exam. Your symptoms and menstrual history. Tests, such as: Blood tests to check if you are pregnant or if you have hormonal changes, a bleeding or thyroid disorder, anemia, or  other problems. Pap test to check for cancerous changes, infections, or inflammation. Endometrial biopsy. This test involves removing a tissue sample from the lining of the uterus (endometrium) to be examined under a microscope. Pelvic ultrasound. This test uses sound waves to create images of your uterus, ovaries, and vagina. The images can show if you have  fibroids or other growths. Hysteroscopy. For this test, a thin, flexible tube with a light on the end (hysteroscope) is used to look inside your uterus. How is this treated? Treatment may not be needed for this condition. If it is needed, the best treatment for you will depend on: Whether you need to prevent pregnancy. Your desire to have children in the future. The cause and severity of your bleeding. Your personal preference. Medicine Medicines are the first step in treatment. You may be treated with: Hormonal birth control methods. These treatments reduce bleeding during your menstrual period. They include: Birth control pills. Skin patch. Vaginal ring. Shots (injections) that you get every 3 months. Hormonal IUD. Implants that go under the skin. Medicines that thicken the blood and slow bleeding. Medicines that reduce swelling, such as ibuprofen. Medicines that contain an artificial (synthetic) hormone called progestin. Medicines that make the ovaries stop working for a short time. Iron supplements to treat anemia.  Surgery If medicines do not work, surgery may be done. Surgical options may include: Dilation and curettage (D&C). In this procedure, your health care provider opens the lowest part of the uterus (cervix) and then scrapes or suctions tissue from the endometrium. This reduces menstrual bleeding. Operative hysteroscopy. In this procedure, a hysteroscope is used to view your uterus and help remove polyps that may be causing heavy periods. Endometrial ablation. This is when various techniques are used to permanently destroy your entire endometrium. After endometrial ablation, most women have little or no menstrual flow. This procedure reduces your ability to become pregnant. Endometrial resection. In this procedure, an electrosurgical wire loop is used to remove the endometrium. This procedure reduces your ability to become pregnant. Hysterectomy. This is surgical removal of  your uterus. This is a permanent procedure that stops menstrual periods. Pregnancy is not possible after a hysterectomy. Follow these instructions at home: Medicines Take over-the-counter and prescription medicines only as told by your health care provider. This includes iron pills. Do not change or switch medicines without asking your health care provider. Do not take aspirin or medicines that contain aspirin 1 week before or during your menstrual period. Aspirin may make bleeding worse. Managing constipation Your iron pills may cause constipation. If you are taking prescription iron supplements, you may need to take these actions to prevent or treat constipation: Drink enough fluid to keep your urine pale yellow. Take over-the-counter or prescription medicines. Eat foods that are high in fiber, such as beans, whole grains, and fresh fruits and vegetables. Limit foods that are high in fat and processed sugars, such as fried or sweet foods. General instructions If you need to change your sanitary pad or tampon more than once every 2 hours, limit your activity until the bleeding stops. Eat well-balanced meals, including foods that are high in iron. Foods that have a lot of iron include leafy green vegetables, meat, liver, eggs, and whole-grain breads and cereals. Do not try to lose weight until the abnormal bleeding has stopped and your blood iron level is back to normal. If you need to lose weight, work with your health care provider to lose weight safely. Keep all follow-up visits.  This is important. Contact a health care provider if: You soak through a pad or tampon every 1 or 2 hours, and this happens every time you have a period. You need to use pads and tampons at the same time because you are bleeding so much. You have nausea, vomiting, diarrhea, or other problems related to medicines you are taking. Get help right away if: You soak through more than a pad or tampon in 1 hour. You pass  clots bigger than 1 inch (2.5 cm) wide. You feel short of breath. You feel like your heart is beating too fast. You feel dizzy or you faint. You feel very weak or tired. Summary Menorrhagia is a form of abnormal uterine bleeding in which menstrual periods are heavy or last longer than normal. Treatment may not be needed for this condition. If it is needed, it may include medicines or procedures. Take over-the-counter and prescription medicines only as told by your health care provider. This includes iron pills. Get help right away if you have heavy bleeding that soaks through more than a pad or tampon in 1 hour, you pass large clots, or you feel dizzy, short of breath, or very weak or tired. This information is not intended to replace advice given to you by your health care provider. Make sure you discuss any questions you have with your health care provider. Document Revised: 11/02/2019 Document Reviewed: 11/02/2019 Elsevier Patient Education  2022 [employer].    If you have been instructed to have an in-person evaluation today at a local Urgent Care facility, please use the link below. It will take you to a list of all of our available [facility], including address, phone number and hours of operation. Please do not delay care.  [facility]  If you or a family member do not have a primary care provider, use the link below to schedule a visit and establish care. When you choose a [facility] primary care physician or advanced practice provider, you gain a long-term partner in health. Find a Primary Care Provider  Learn more about [facility]'s in-office and virtual care options: [facility] - Get Care Now

## 2021-02-15 NOTE — Progress Notes (Signed)
Based on what you shared with me, I feel your condition warrants further evaluation and I recommend that you be seen in a face to face visit.  This is not something we can fully evaluate via e-visit. As such you will need to be seen in person, either with your PCP, gynecologist or at a local urgent care for further evaluation.    NOTE: There will be NO CHARGE for this eVisit   If you are having a true medical emergency please call 911.      For an urgent face to face visit, Hannah Ponce has six urgent care centers for your convenience:     [facility] at [facility] Directions [phone] [address] [place], [place] [address]    [facility]) Get Driving Directions [phone] [address] [place], [place] [address]  [facility]) Get Driving Directions [phone] [address] [place],  [place]  [address]  [facility] at [facility] Get Driving Directions [phone] [address] [place], [place] [address]   [facility] Urgent [facility] at [facility] Get Driving Directions  [phone] [address] [place], [place] [address]   [facility] Urgent [facility] at [facility] Directions [phone] [address] [place], [place] [address]  Your MyChart E-visit questionnaire answers were reviewed by a board certified advanced clinical practitioner to complete your personal care plan based on your specific symptoms.  Thank you for using e-Visits.

## 2021-02-16 ENCOUNTER — Encounter (HOSPITAL_BASED_OUTPATIENT_CLINIC_OR_DEPARTMENT_OTHER): Payer: Self-pay | Admitting: Obstetrics & Gynecology

## 2021-02-19 ENCOUNTER — Other Ambulatory Visit: Payer: Self-pay

## 2021-02-19 ENCOUNTER — Encounter (HOSPITAL_BASED_OUTPATIENT_CLINIC_OR_DEPARTMENT_OTHER): Payer: Self-pay | Admitting: Obstetrics & Gynecology

## 2021-02-19 NOTE — Progress Notes (Signed)
Spoke w/ via phone for pre-op interview--- pt Lab needs dos---- urine preg (per anes)/  pre-op orders pending              Lab results------ current ekg in epic/ chart COVID test -----patient states asymptomatic no test needed Arrive at ------- 1100 on 02-21-2021 NPO after MN NO Solid Food.  Clear liquids from MN until--- 1000 Med rec completed Medications to take morning of surgery -----zoloft Diabetic medication ----- n/a Patient instructed no nail polish to be worn day of surgery Patient instructed to bring photo id and insurance card day of surgery Patient aware to have Driver (ride ) / caregiver for 24 hours after surgery --- sig other, joshua johnson Patient Special Instructions ----- n/a Pre-Op special Istructions ----- sent inbox message to dr Debroah Loop in epic, requested orders Patient verbalized understanding of instructions that were given at this phone interview. Patient denies shortness of breath, chest pain, fever, cough at this phone interview.

## 2021-02-20 NOTE — Anesthesia Preprocedure Evaluation (Addendum)
Anesthesia Evaluation  Patient identified by MRN, date of birth, ID band Patient awake    Reviewed: Allergy & Precautions, NPO status , Patient's Chart, lab work & pertinent test results  Airway Mallampati: II  TM Distance: >3 FB Neck ROM: Full    Dental no notable dental hx. (+) Teeth Intact, Dental Advisory Given   Pulmonary neg pulmonary ROS, former smoker,    Pulmonary exam normal breath sounds clear to auscultation       Cardiovascular Exercise Tolerance: Good Normal cardiovascular exam Rhythm:Regular Rate:Normal     Neuro/Psych negative neurological ROS     GI/Hepatic negative GI ROS, Neg liver ROS,   Endo/Other  negative endocrine ROS  Renal/GU negative Renal ROS     Musculoskeletal   Abdominal   Peds  Hematology   Anesthesia Other Findings   Reproductive/Obstetrics negative OB ROS                           Anesthesia Physical Anesthesia Plan  ASA: 1  Anesthesia Plan: General   Post-op Pain Management: Toradol IV (intra-op), Tylenol PO (pre-op), Minimal or no pain anticipated and Dilaudid IV   Induction: Intravenous  PONV Risk Score and Plan: 4 or greater and Midazolam, Ondansetron, Treatment may vary due to age or medical condition, Scopolamine patch - Pre-op and Dexamethasone  Airway Management Planned: Oral ETT  Additional Equipment:   Intra-op Plan:   Post-operative Plan: Extubation in OR  Informed Consent: I have reviewed the patients History and Physical, chart, labs and discussed the procedure including the risks, benefits and alternatives for the proposed anesthesia with the patient or authorized representative who has indicated his/her understanding and acceptance.     Dental advisory given  Plan Discussed with: CRNA and Anesthesiologist  Anesthesia Plan Comments: (GA)       Anesthesia Quick Evaluation

## 2021-02-21 ENCOUNTER — Encounter (HOSPITAL_BASED_OUTPATIENT_CLINIC_OR_DEPARTMENT_OTHER)
Admission: RE | Disposition: A | Discharge status: Home / Self Care | Payer: Self-pay | Source: Home / Self Care | Attending: Obstetrics and Gynecology

## 2021-02-21 ENCOUNTER — Other Ambulatory Visit: Payer: Self-pay

## 2021-02-21 ENCOUNTER — Ambulatory Visit (HOSPITAL_BASED_OUTPATIENT_CLINIC_OR_DEPARTMENT_OTHER): Payer: Medicaid Other | Admitting: Anesthesiology

## 2021-02-21 ENCOUNTER — Ambulatory Visit (HOSPITAL_BASED_OUTPATIENT_CLINIC_OR_DEPARTMENT_OTHER)
Admission: RE | Admit: 2021-02-21 | Discharge: 2021-02-21 | Disposition: A | Discharge status: Home / Self Care | Payer: Medicaid Other | Attending: Obstetrics and Gynecology | Admitting: Obstetrics and Gynecology

## 2021-02-21 ENCOUNTER — Encounter (HOSPITAL_BASED_OUTPATIENT_CLINIC_OR_DEPARTMENT_OTHER): Payer: Self-pay | Admitting: Obstetrics and Gynecology

## 2021-02-21 DIAGNOSIS — Z3009 Encounter for other general counseling and advice on contraception: Secondary | ICD-10-CM

## 2021-02-21 DIAGNOSIS — Z302 Encounter for sterilization: Secondary | ICD-10-CM | POA: Insufficient documentation

## 2021-02-21 HISTORY — DX: Palpitations: R00.2

## 2021-02-21 HISTORY — DX: Excessive and frequent menstruation with regular cycle: N92.0

## 2021-02-21 HISTORY — DX: Personal history of other diseases of the female genital tract: Z87.42

## 2021-02-21 HISTORY — PX: LAPAROSCOPIC TUBAL LIGATION: SHX1937

## 2021-02-21 LAB — TYPE AND SCREEN
ABO/RH(D): O POS
Antibody Screen: NEGATIVE

## 2021-02-21 LAB — POCT PREGNANCY, URINE: Preg Test, Ur: NEGATIVE

## 2021-02-21 SURGERY — LIGATION, FALLOPIAN TUBE, LAPAROSCOPIC
Anesthesia: General | Laterality: Bilateral

## 2021-02-21 MED ORDER — MIDAZOLAM HCL 2 MG/2ML IJ SOLN
INTRAMUSCULAR | Status: AC
  Filled 2021-02-21: qty 2

## 2021-02-21 MED ORDER — PROPOFOL 10 MG/ML IV BOLUS
INTRAVENOUS | Status: AC
  Filled 2021-02-21: qty 20

## 2021-02-21 MED ORDER — KETOROLAC TROMETHAMINE 30 MG/ML IJ SOLN
INTRAMUSCULAR | Status: DC | PRN
  Administered 2021-02-21: 30 mg via INTRAVENOUS

## 2021-02-21 MED ORDER — OXYCODONE HCL 5 MG PO TABS
5.0000 mg | ORAL_TABLET | Freq: Once | ORAL | Status: AC | PRN
  Administered 2021-02-21: 14:00:00 5 mg via ORAL

## 2021-02-21 MED ORDER — BUPIVACAINE HCL (PF) 0.25 % IJ SOLN
INTRAMUSCULAR | Status: DC | PRN
  Administered 2021-02-21: 10 mL

## 2021-02-21 MED ORDER — KETOROLAC TROMETHAMINE 15 MG/ML IJ SOLN: 15.0000 mg | INTRAMUSCULAR | Status: DC

## 2021-02-21 MED ORDER — SUGAMMADEX SODIUM 200 MG/2ML IV SOLN
INTRAVENOUS | Status: DC | PRN
  Administered 2021-02-21: 150 mg via INTRAVENOUS

## 2021-02-21 MED ORDER — SODIUM CHLORIDE 0.9 % IR SOLN
Status: DC | PRN
  Administered 2021-02-21: 500 mL

## 2021-02-21 MED ORDER — DEXMEDETOMIDINE (PRECEDEX) IN NS 20 MCG/5ML (4 MCG/ML) IV SYRINGE
PREFILLED_SYRINGE | INTRAVENOUS | Status: AC
  Filled 2021-02-21: qty 5

## 2021-02-21 MED ORDER — DEXAMETHASONE SODIUM PHOSPHATE 10 MG/ML IJ SOLN
INTRAMUSCULAR | Status: DC | PRN
  Administered 2021-02-21: 10 mg via INTRAVENOUS

## 2021-02-21 MED ORDER — PROPOFOL 10 MG/ML IV BOLUS
INTRAVENOUS | Status: DC | PRN
  Administered 2021-02-21: 70 mg via INTRAVENOUS

## 2021-02-21 MED ORDER — OXYCODONE HCL 5 MG/5ML PO SOLN: 5.0000 mg | Freq: Once | ORAL | Status: AC | PRN

## 2021-02-21 MED ORDER — DEXMEDETOMIDINE (PRECEDEX) IN NS 20 MCG/5ML (4 MCG/ML) IV SYRINGE
PREFILLED_SYRINGE | INTRAVENOUS | Status: DC | PRN
  Administered 2021-02-21 (×2): 4 ug via INTRAVENOUS

## 2021-02-21 MED ORDER — ACETAMINOPHEN 500 MG PO TABS
1000.0000 mg | ORAL_TABLET | ORAL | Status: AC
  Administered 2021-02-21: 11:00:00 1000 mg via ORAL

## 2021-02-21 MED ORDER — ROCURONIUM BROMIDE 10 MG/ML (PF) SYRINGE
PREFILLED_SYRINGE | INTRAVENOUS | Status: DC | PRN
  Administered 2021-02-21: 30 mg via INTRAVENOUS

## 2021-02-21 MED ORDER — OXYCODONE HCL 5 MG PO TABS: 5.0000 mg | ORAL_TABLET | ORAL | 0 refills | Status: AC | PRN

## 2021-02-21 MED ORDER — ONDANSETRON HCL 4 MG/2ML IJ SOLN
INTRAMUSCULAR | Status: DC | PRN
  Administered 2021-02-21: 4 mg via INTRAVENOUS

## 2021-02-21 MED ORDER — ONDANSETRON HCL 4 MG/2ML IJ SOLN
INTRAMUSCULAR | Status: AC
  Filled 2021-02-21: qty 2

## 2021-02-21 MED ORDER — ARTIFICIAL TEARS OPHTHALMIC OINT
TOPICAL_OINTMENT | OPHTHALMIC | Status: AC
  Filled 2021-02-21: qty 3.5

## 2021-02-21 MED ORDER — ONDANSETRON HCL 4 MG/2ML IJ SOLN: 4.0000 mg | Freq: Once | INTRAMUSCULAR | Status: DC | PRN

## 2021-02-21 MED ORDER — LACTATED RINGERS IV SOLN: INTRAVENOUS | Status: DC

## 2021-02-21 MED ORDER — OXYCODONE HCL 5 MG PO TABS
ORAL_TABLET | ORAL | Status: AC
  Filled 2021-02-21: qty 1

## 2021-02-21 MED ORDER — IBUPROFEN 600 MG PO TABS: 600.0000 mg | ORAL_TABLET | Freq: Four times a day (QID) | ORAL | 1 refills | Status: AC | PRN

## 2021-02-21 MED ORDER — KETOROLAC TROMETHAMINE 30 MG/ML IJ SOLN
INTRAMUSCULAR | Status: AC
  Filled 2021-02-21: qty 1

## 2021-02-21 MED ORDER — HYDROMORPHONE HCL 1 MG/ML IJ SOLN: 0.2500 mg | INTRAMUSCULAR | Status: DC | PRN

## 2021-02-21 MED ORDER — POVIDONE-IODINE 10 % EX SWAB: 2.0000 "application " | Freq: Once | CUTANEOUS | Status: DC

## 2021-02-21 MED ORDER — FENTANYL CITRATE (PF) 100 MCG/2ML IJ SOLN
INTRAMUSCULAR | Status: DC | PRN
  Administered 2021-02-21 (×2): 50 ug via INTRAVENOUS

## 2021-02-21 MED ORDER — DEXAMETHASONE SODIUM PHOSPHATE 10 MG/ML IJ SOLN
INTRAMUSCULAR | Status: AC
  Filled 2021-02-21: qty 1

## 2021-02-21 MED ORDER — ROCURONIUM BROMIDE 10 MG/ML (PF) SYRINGE
PREFILLED_SYRINGE | INTRAVENOUS | Status: AC
  Filled 2021-02-21: qty 10

## 2021-02-21 MED ORDER — FENTANYL CITRATE (PF) 100 MCG/2ML IJ SOLN
INTRAMUSCULAR | Status: AC
  Filled 2021-02-21: qty 2

## 2021-02-21 MED ORDER — KETOROLAC TROMETHAMINE 30 MG/ML IJ SOLN: 30.0000 mg | Freq: Once | INTRAMUSCULAR | Status: DC | PRN

## 2021-02-21 MED ORDER — MIDAZOLAM HCL 2 MG/2ML IJ SOLN
INTRAMUSCULAR | Status: DC | PRN
  Administered 2021-02-21: 2 mg via INTRAVENOUS

## 2021-02-21 MED ORDER — LIDOCAINE 2% (20 MG/ML) 5 ML SYRINGE
INTRAMUSCULAR | Status: AC
  Filled 2021-02-21: qty 5

## 2021-02-21 MED ORDER — ACETAMINOPHEN 500 MG PO TABS
ORAL_TABLET | ORAL | Status: AC
  Filled 2021-02-21: qty 2

## 2021-02-21 MED ORDER — LIDOCAINE 2% (20 MG/ML) 5 ML SYRINGE
INTRAMUSCULAR | Status: DC | PRN
Start: 2021-02-21 — End: 2021-02-21
  Administered 2021-02-21: 60 mg via INTRAVENOUS

## 2021-02-21 SURGICAL SUPPLY — 42 items
ADH SKN CLS APL DERMABOND .7 (GAUZE/BANDAGES/DRESSINGS) ×1
BAG COUNTER SPONGE SURGICOUNT (BAG) ×2 IMPLANT
BAG SPEC RTRVL LRG 6X4 10 (ENDOMECHANICALS)
BAG SPNG CNTER NS LX DISP (BAG) ×1
BAG SURGICOUNT SPONGE COUNTING (BAG) ×1
CABLE HIGH FREQUENCY MONO STRZ (ELECTRODE) IMPLANT
DERMABOND ADVANCED (GAUZE/BANDAGES/DRESSINGS) ×2
DERMABOND ADVANCED .7 DNX12 (GAUZE/BANDAGES/DRESSINGS) ×1 IMPLANT
DURAPREP 26ML APPLICATOR (WOUND CARE) ×3 IMPLANT
GLOVE SURG ENC MOIS LTX SZ6 (GLOVE) ×3 IMPLANT
GLOVE SURG UNDER LTX SZ6.5 (GLOVE) ×12 IMPLANT
GOWN STRL REUS W/ TWL LRG LVL3 (GOWN DISPOSABLE) ×3 IMPLANT
GOWN STRL REUS W/TWL LRG LVL3 (GOWN DISPOSABLE) ×9
IRRIGATION STRYKERFLOW (MISCELLANEOUS) IMPLANT
IRRIGATOR STRYKERFLOW (MISCELLANEOUS)
KIT TURNOVER CYSTO (KITS) ×3 IMPLANT
LIGASURE VESSEL 5MM BLUNT TIP (ELECTROSURGICAL) IMPLANT
NDL INSUFFLATION 14GA 120MM (NEEDLE) ×1 IMPLANT
NEEDLE INSUFFLATION 14GA 120MM (NEEDLE) ×3 IMPLANT
PACK LAPAROSCOPY BASIN (CUSTOM PROCEDURE TRAY) ×3 IMPLANT
PACK TRENDGUARD 450 HYBRID PRO (MISCELLANEOUS) IMPLANT
PACK TRENDGUARD 600 HYBRD PROC (MISCELLANEOUS) IMPLANT
POUCH LAPAROSCOPIC INSTRUMENT (MISCELLANEOUS) ×1 IMPLANT
POUCH SPECIMEN RETRIEVAL 10MM (ENDOMECHANICALS) IMPLANT
PROTECTOR NERVE ULNAR (MISCELLANEOUS) ×6 IMPLANT
SEALER TISSUE G2 CVD JAW 35 (ENDOMECHANICALS) IMPLANT
SEALER TISSUE G2 CVD JAW 45CM (ENDOMECHANICALS) ×2
SHEARS HARMONIC ACE PLUS 36CM (ENDOMECHANICALS) IMPLANT
SLEEVE ADV FIXATION 5X100MM (TROCAR) IMPLANT
SUT VICRYL 0 UR6 27IN ABS (SUTURE) IMPLANT
SUT VICRYL 4-0 PS2 18IN ABS (SUTURE) ×3 IMPLANT
SYR 30ML LL (SYRINGE) IMPLANT
SYSTEM CARTER THOMASON II (TROCAR) IMPLANT
TOWEL OR 17X26 10 PK STRL BLUE (TOWEL DISPOSABLE) ×6 IMPLANT
TRAY FOLEY W/BAG SLVR 14FR (SET/KITS/TRAYS/PACK) ×3 IMPLANT
TRENDGUARD 450 HYBRID PRO PACK (MISCELLANEOUS) ×3
TRENDGUARD 600 HYBRID PROC PK (MISCELLANEOUS)
TROCAR ADV FIXATION 5X100MM (TROCAR) ×1 IMPLANT
TROCAR XCEL NON BLADE 8MM B8LT (ENDOMECHANICALS) ×2 IMPLANT
TROCAR XCEL NON-BLD 11X100MML (ENDOMECHANICALS) IMPLANT
TROCAR XCEL NON-BLD 5MMX100MML (ENDOMECHANICALS) ×6 IMPLANT
WARMER LAPAROSCOPE (MISCELLANEOUS) ×3 IMPLANT

## 2021-02-21 NOTE — Anesthesia Procedure Notes (Signed)
Procedure Name: Intubation Date/Time: 02/21/2021 12:23 PM Performed by: Pearson Grippe, CRNA Pre-anesthesia Checklist: Patient identified, Emergency Drugs available, Suction available and Patient being monitored Patient Re-evaluated:Patient Re-evaluated prior to induction Oxygen Delivery Method: Circle system utilized Preoxygenation: Pre-oxygenation with 100% oxygen Induction Type: IV induction Ventilation: Mask ventilation without difficulty Laryngoscope Size: Miller and 2 Grade View: Grade I Tube type: Oral Tube size: 7.0 mm Number of attempts: 1 Airway Equipment and Method: Stylet and Oral airway Placement Confirmation: ETT inserted through vocal cords under direct vision, positive ETCO2 and breath sounds checked- equal and bilateral Secured at: 20 cm Tube secured with: Tape Dental Injury: Teeth and Oropharynx as per pre-operative assessment

## 2021-02-21 NOTE — Transfer of Care (Signed)
Immediate Anesthesia Transfer of Care Note  Patient: Hannah Ponce  Procedure(s) Performed: LAPAROSCOPIC BILATERAL SALPENGECTOMY (Bilateral)  Patient Location: PACU  Anesthesia Type:General  Level of Consciousness: awake, alert  and oriented  Airway & Oxygen Therapy: Patient Spontanous Breathing and Patient connected to face mask oxygen  Post-op Assessment: Report given to RN and Post -op Vital signs reviewed and stable  Post vital signs: Reviewed and stable  Last Vitals:  Vitals Value Taken Time  BP 129/86 02/21/21 1311  Temp 36.6 C 02/21/21 1311  Pulse 109 02/21/21 1311  Resp 16 02/21/21 1311  SpO2 99 % 02/21/21 1311  Vitals shown include unvalidated device data.  Last Pain:  Vitals:   02/21/21 1125  TempSrc: Oral  PainSc: 0-No pain      Patients Stated Pain Goal: 4 (02/21/21 1125)  Complications: No notable events documented.

## 2021-02-21 NOTE — Discharge Instructions (Addendum)
DISCHARGE INSTRUCTIONS: Laparoscopy  The following instructions have been prepared to help you care for yourself upon your return home today.  Wound care:  Do not get the incision wet for the first 24 hours. The incision should be kept clean and dry.  The Band-Aids or dressings may be removed the day after surgery.  Should the incision become sore, red, and swollen after the first week, check with your doctor.  Personal hygiene:  Shower the day after your procedure.  Activity and limitations:  Do NOT drive or operate any equipment today.  Do NOT lift anything more than 15 pounds for 2-3 weeks after surgery.  Do NOT rest in bed all day.  Walking is encouraged. Walk each day, starting slowly with 5-minute walks 3 or 4 times a day. Slowly increase the length of your walks.  Walk up and down stairs slowly.  Do NOT do strenuous activities, such as golfing, playing tennis, bowling, running, biking, weight lifting, gardening, mowing, or vacuuming for 2-4 weeks. Ask your doctor when it is okay to start.  Diet: Eat a light meal as desired this evening. You may resume your usual diet tomorrow.  Return to work: This is dependent on the type of work you do. For the most part you can return to a desk job within a week of surgery. If you are more active at work, please discuss this with your doctor.  What to expect after your surgery: You may have a slight burning sensation when you urinate on the first day. You may have a very small amount of blood in the urine. Expect to have a small amount of vaginal discharge/light bleeding for 1-2 weeks. It is not unusual to have abdominal soreness and bruising for up to 2 weeks. You may be tired and need more rest for about 1 week. You may experience shoulder pain for 24-72 hours. Lying flat in bed may relieve it.  Call your doctor for any of the following:  Develop a fever of 100.4 or greater  Inability to urinate 6 hours after discharge from hospital  Severe  pain not relieved by pain medications  Persistent of heavy bleeding at incision site  Redness or swelling around incision site after a week  Increasing nausea or vomiting   Post Anesthesia Home Care Instructions  Activity: Get plenty of rest for the remainder of the day. A responsible individual must stay with you for 24 hours following the procedure.  For the next 24 hours, DO NOT: -Drive a car -Advertising copywriter -Drink alcoholic beverages -Take any medication unless instructed by your physician -Make any legal decisions or sign important papers.  Meals: Start with liquid foods such as gelatin or soup. Progress to regular foods as tolerated. Avoid greasy, spicy, heavy foods. If nausea and/or vomiting occur, drink only clear liquids until the nausea and/or vomiting subsides. Call your physician if vomiting continues.  Special Instructions/Symptoms: Your throat may feel dry or sore from the anesthesia or the breathing tube placed in your throat during surgery. If this causes discomfort, gargle with warm salt water. The discomfort should disappear within 24 hours.  No acetaminophen/Tylenol until after 5:00 pm today if needed. No ibuprofen, Advil, Aleve, Motrin, ketorolac, meloxicam, naproxen, or other NSAIDS until after 6:45 pm today if needed.

## 2021-02-21 NOTE — Op Note (Signed)
Hannah Ponce PROCEDURE DATE: 02/21/2021   PREOPERATIVE DIAGNOSIS:  Undesired fertility  POSTOPERATIVE DIAGNOSIS:  Undesired fertility  PROCEDURE:  Laparoscopic Bilateral Salpingectomy   SURGEON:  Dr. Milas Hock  ASSISTANT:  None  ANESTHESIA:  General endotracheal  COMPLICATIONS:  None immediate.  ESTIMATED BLOOD LOSS:  5 ml.  FLUIDS: 900 ml LR.  URINE OUTPUT:  50 ml of clear urine.  INDICATIONS: 25 y.o. [id number] with undesired fertility, desires permanent sterilization.  Other forms of contraception were discussed with patient and emphasized alternatives of vasectomy, IUDs and Nexplanon as they have equivalent contraceptive efficacy; she declines all other modalities.  Risks of procedure discussed with patient including permanence of method, risk of regret, bleeding, infection, injury to surrounding organs and need for additional procedures including laparotomy.  Failure risk less than 0.5% with increased risk of ectopic gestation if pregnancy occurs was also discussed with patient.  Written informed consent was obtained.    FINDINGS:  Normal uterus, fallopian tubes, and ovaries.  TECHNIQUE:  The patient was taken to the operating room where general anesthesia was obtained without difficulty.  She was then placed in the dorsal lithotomy position and prepared and draped in sterile fashion. A timeout was performed.   A catheter was used to drain her bladder. She was prepped and draped in the usual sterile fashion in the dorsal lithotomy position.    A skin incision was made with the 11 blade scalpel in the umbilicus. I entered her abdominal cavity through the natural umbilical defect with a kelly clamp. The 5 mm optiview port was inserted into the abdomen under direct visualization. Pneumoperitoneum was achieved to a pressure of 15. Below the point of entry and the pelvis was inspected with no evidence of injury. The patient was placed in steep trendelenburg.  The scalpel was then  used to make two incisions, one in the LLQ and one suprapubically (3cm above pubic bone). 58mm optiview port was inserted on the left and 38mm port placed suprapubically. The ureters were identified bilaterally. The fallopian tubes were cauterized, cut, and detached from their surrounding pelvic structures with the Enseal. No bleeding was noted. The specimens were removed through the 64mm port. The pelvis was inspected and was hemostatic. All ports were then withdrawn and the gas drained from abdomen with 3 valsalvas. The incisions were injected with 0.25% marcaine for a total of 10 cc. They were then closed in a subcuticular fashion with 4-0 vicryl and dermabond was placed.  The patient will be discharged to home as per PACU criteria.  Routine postoperative instructions given.

## 2021-02-21 NOTE — Anesthesia Postprocedure Evaluation (Signed)
Anesthesia Post Note  Patient: Hannah Ponce  Procedure(s) Performed: LAPAROSCOPIC BILATERAL SALPENGECTOMY (Bilateral)     Patient location during evaluation: PACU Anesthesia Type: General Level of consciousness: awake and alert Pain management: pain level controlled Vital Signs Assessment: post-procedure vital signs reviewed and stable Respiratory status: spontaneous breathing, nonlabored ventilation, respiratory function stable and patient connected to nasal cannula oxygen Cardiovascular status: blood pressure returned to baseline and stable Postop Assessment: no apparent nausea or vomiting Anesthetic complications: no   No notable events documented.  Last Vitals:  Vitals:   02/21/21 1345 02/21/21 1418  BP: 108/72 110/71  Pulse: 83 79  Resp: 14 10  Temp:  36.7 C  SpO2: 98% 98%    Last Pain:  Vitals:   02/21/21 1406  TempSrc:   PainSc: 6                  Trevor Iha

## 2021-02-21 NOTE — H&P (Signed)
Hannah Ponce is an 25 y.o. female [birth year] who desires permanent sterilization. She is 100% sure she is done with childbearing.   Pertinent Gynecological History: Blood transfusions: none Sexually transmitted diseases: no past history Previous GYN Procedures:  None   Last pap: abnormal: LSIL  Date: 04/2020 OB History: G4, P3   Menstrual History: Patient's last menstrual period was 02/19/2021 (exact date).    Past Medical History:  Diagnosis Date   Anemia    Heart murmur    History of abnormal cervical Pap smear    History of COVID-19 03/2019   per pt moderate symptoms that resovled   History of palpitations    pt evaluated by cardiologist--- dr Antonieta Iba , office note in epic 09-08-2020, pt gestational 31 wks w/ palpitations and sob;  echo was ordered and scheduled for 10-24-2020 no show pt delivered baby 10-29-2020  (02-19-2021  pt stated symptoms/ issues since delivery)   History of seizure    02-19-2021  per pt had a seizure approx 2019 x1  after reaction to hair dye,  than stated had seizure 05/ 2020 x1 went to ED (in epic) dx with seizure-like activity, none since   Menorrhagia with regular cycle     Past Surgical History:  Procedure Laterality Date   NO PAST SURGERIES      Family History  Problem Relation Age of Onset   Healthy Mother     Social History:  reports that she quit smoking about 11 months ago. Her smoking use included cigarettes. She has never used smokeless tobacco. She reports current alcohol use. She reports that she does not currently use drugs after having used the following drugs: Marijuana.  Allergies:  Allergies  Allergen Reactions   Avocado Itching and Rash    Medications Prior to Admission  Medication Sig Dispense Refill Last Dose   Prenatal MV & Min w/FA-DHA (PRENATAL GUMMIES PO) Take by mouth.   02/21/2021   sertraline (ZOLOFT) 50 MG tablet Take 1 tablet (50 mg total) by mouth daily. 30 tablet 2 02/21/2021 at 0800    Review of Systems   All other systems reviewed and are negative.  Blood pressure 133/89, pulse 84, temperature 97.9 F (36.6 C), temperature source Oral, resp. rate 15, height 5\' 3"  (1.6 m), weight 49 kg, last menstrual period 02/19/2021, SpO2 100 %, unknown if currently breastfeeding. Physical Exam Constitutional:      Appearance: Normal appearance. She is normal weight.  HENT:     Head: Normocephalic and atraumatic.     Nose: Nose normal.     Mouth/Throat:     Mouth: Mucous membranes are moist.     Pharynx: Oropharynx is clear.  Eyes:     Extraocular Movements: Extraocular movements intact.     Conjunctiva/sclera: Conjunctivae normal.     Pupils: Pupils are equal, round, and reactive to light.  Cardiovascular:     Rate and Rhythm: Normal rate and regular rhythm.     Pulses: Normal pulses.     Heart sounds: Normal heart sounds.  Pulmonary:     Effort: Pulmonary effort is normal.  Abdominal:     General: Abdomen is flat. Bowel sounds are normal.     Palpations: Abdomen is soft.  Musculoskeletal:        General: Normal range of motion.     Cervical back: Normal range of motion and neck supple.  Skin:    General: Skin is warm.  Neurological:     General: No focal deficit present.  Mental Status: She is alert and oriented to person, place, and time.  Psychiatric:        Mood and Affect: Mood normal.        Behavior: Behavior normal.        Thought Content: Thought content normal.        Judgment: Judgment normal.    Results for orders placed or performed during the hospital encounter of 02/21/21 (from the past 24 hour(s))  Pregnancy, urine POC     Status: None   Collection Time: 02/21/21 10:55 AM  Result Value Ref Range   Preg Test, Ur NEGATIVE NEGATIVE    No results found.  Assessment/Plan: Unwanted fertility - She desires permanent sterilization. Discussed alternatives including LARC options and vasectomy. We discussed that this is not reversible and entails full removal of the  tubes. She declines LARC options.  - Discussed risks: bleeding, infection, injury to surrounding organs/tissues, possible need for open surgery - We discussed pain medication following surgery. She would appreciate oxycodone rx after. We discussed addictive nature of opioids.  - Reviewed restrictions and recovery following surgery   Milas Hock 02/21/2021, 12:09 PM

## 2021-02-22 ENCOUNTER — Encounter (HOSPITAL_BASED_OUTPATIENT_CLINIC_OR_DEPARTMENT_OTHER): Payer: Self-pay | Admitting: Obstetrics and Gynecology

## 2021-02-22 LAB — SURGICAL PATHOLOGY

## 2021-02-28 ENCOUNTER — Telehealth: Payer: Self-pay | Admitting: General Practice

## 2021-02-28 ENCOUNTER — Other Ambulatory Visit: Payer: Self-pay | Admitting: Obstetrics and Gynecology

## 2021-02-28 DIAGNOSIS — Z0000 Encounter for general adult medical examination without abnormal findings: Secondary | ICD-10-CM

## 2021-02-28 NOTE — Telephone Encounter (Signed)
Patient called into front office stating she just picked up her last refill on her zoloft and wants to know how to go about getting additional refills.   Called patient back and discussed getting future refills from a PCP. Patient states she doesn't have one at this time and cannot find someone accepting new patients. Scheduled appt with [facility] for tomorrow at 850 and discussed with patient. Patient verbalized understanding & confirms appt time/date.

## 2021-03-01 ENCOUNTER — Ambulatory Visit (INDEPENDENT_AMBULATORY_CARE_PROVIDER_SITE_OTHER): Payer: Medicaid Other | Admitting: Primary Care

## 2021-03-20 ENCOUNTER — Telehealth: Payer: Medicaid Other

## 2021-03-20 ENCOUNTER — Telehealth: Payer: Medicaid Other | Admitting: Physician Assistant

## 2021-03-20 DIAGNOSIS — N921 Excessive and frequent menstruation with irregular cycle: Secondary | ICD-10-CM

## 2021-03-20 NOTE — Patient Instructions (Signed)
°  Hannah Ponce, thank you for joining Piedad Climes, PA-C for today's virtual visit.  While this provider is not your primary care provider (PCP), if your PCP is located in our provider database this encounter information will be shared with them immediately following your visit.  Consent: (Patient) Hannah Ponce provided verbal consent for this virtual visit at the beginning of the encounter.  Current Medications:  Current Outpatient Medications:    ibuprofen (ADVIL) 600 MG tablet, Take 1 tablet (600 mg total) by mouth every 6 (six) hours as needed., Disp: 60 tablet, Rfl: 1   oxyCODONE (OXY IR/ROXICODONE) 5 MG immediate release tablet, Take 1 tablet (5 mg total) by mouth every 4 (four) hours as needed for severe pain or breakthrough pain., Disp: 10 tablet, Rfl: 0   Prenatal MV & Min w/FA-DHA (PRENATAL GUMMIES PO), Take by mouth., Disp: , Rfl:    sertraline (ZOLOFT) 50 MG tablet, Take 1 tablet (50 mg total) by mouth daily., Disp: 30 tablet, Rfl: 2   Medications ordered in this encounter:  No orders of the defined types were placed in this encounter.    *If you need refills on other medications prior to your next appointment, please contact your pharmacy*  Follow-Up: Call back or seek an in-person evaluation if the symptoms worsen or if the condition fails to improve as anticipated.  Other Instructions   If you have been instructed to have an in-person evaluation today at a local Urgent Care facility, please use the link below. It will take you to a list of all of our available [facility], including address, phone number and hours of operation. Please do not delay care.  [facility]  If you or a family member do not have a primary care provider, use the link below to schedule a visit and establish care. When you choose a [facility] primary care physician or advanced practice provider, you gain a long-term partner in health. Find a Primary Care  Provider  Learn more about [facility]'s in-office and virtual care options: [facility] - Get Care Now

## 2021-03-20 NOTE — Progress Notes (Signed)
Virtual Visit Consent   Hannah Ponce, you are scheduled for a virtual visit with a [facility] provider today.     Just as with appointments in the office, your consent must be obtained to participate.  Your consent will be active for this visit and any virtual visit you may have with one of our providers in the next 365 days.     If you have a MyChart account, a copy of this consent can be sent to you electronically.  All virtual visits are billed to your insurance company just like a traditional visit in the office.    As this is a virtual visit, video technology does not allow for your provider to perform a traditional examination.  This may limit your provider's ability to fully assess your condition.  If your provider identifies any concerns that need to be evaluated in person or the need to arrange testing (such as labs, EKG, etc.), we will make arrangements to do so.     Although advances in technology are sophisticated, we cannot ensure that it will always work on either your end or our end.  If the connection with a video visit is poor, the visit may have to be switched to a telephone visit.  With either a video or telephone visit, we are not always able to ensure that we have a secure connection.     I need to obtain your verbal consent now.   Are you willing to proceed with your visit today?    Hannah Ponce has provided verbal consent on 03/20/2021 for a virtual visit (video or telephone).   Hannah Ponce, [place]   Date: 03/20/2021 1:21 PM   Virtual Visit via Video Note   I, Hannah Ponce, connected with  Hannah Ponce  ([id number], 26) on 03/20/21 at  1:15 PM EST by a video-enabled telemedicine application and verified that I am speaking with the correct person using two identifiers.  Location: Patient: Virtual Visit Location Patient: Home Provider: Virtual Visit Location Provider: Home Office   I discussed the limitations of evaluation and management by  telemedicine and the availability of in person appointments. The patient expressed understanding and agreed to proceed.    History of Present Illness: Hannah Ponce is a 26 y.o. who identifies as a female who was assigned female at birth, and is being seen today for excessive vaginal bleeding. Patient with history of dysmenorrhea and excessive menstrual bleeding, previous/y requiring transfusion due to blood loss. Since giving birth to her child in August, has had 2 menstrual periods. Notes current period starting yesterday with severe bleeding into today. Is changing pads multiple times per hour and notes they are each completely soaked through. She has not been able to sit without a barrier due to heavy flow. Denies any racing heart or SOB. Does note dizziness. Notes she contacted her GYN this morning but they cannot see her until 04/30/2021. Is awaiting PCP.   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   Arthralgia 04/14/2020    Allergies:  Allergies  Allergen Reactions   Avocado Itching and Rash   Medications:  Current Outpatient Medications:    ibuprofen (  ADVIL) 600 MG tablet, Take 1 tablet (600 mg total) by mouth every 6 (six) hours as needed., Disp: 60 tablet, Rfl: 1   oxyCODONE (OXY IR/ROXICODONE) 5 MG immediate release tablet, Take 1 tablet (5 mg total) by mouth every 4 (four) hours as needed for severe pain or breakthrough pain., Disp: 10 tablet, Rfl: 0   Prenatal MV & Min w/FA-DHA (PRENATAL GUMMIES PO), Take by mouth., Disp: , Rfl:    sertraline (ZOLOFT) 50 MG tablet, Take 1 tablet (50 mg total) by mouth daily., Disp: 30 tablet, Rfl: 2  Observations/Objective: Patient is  well-developed, well-nourished in no acute distress.  Resting comfortably at home.  Head is normocephalic, atraumatic.  No labored breathing. Speech is clear and coherent with logical content.  Patient is alert and oriented at baseline.   Assessment and Plan: 1. Menorrhagia with irregular cycle  Giving severity of bleeding and occasional dizziness, she needs in-person evaluation. Declines ER. Gave resources for UC so she can be taken there for at least initial evaluation and management since she cannot get in with GYN and is between PCPs.   Follow Up Instructions: I discussed the assessment and treatment plan with the patient. The patient was provided an opportunity to ask questions and all were answered. The patient agreed with the plan and demonstrated an understanding of the instructions.  A copy of instructions were sent to the patient via MyChart unless otherwise noted below.   The patient was advised to call back or seek an in-person evaluation if the symptoms worsen or if the condition fails to improve as anticipated.  Time:  I spent 10 minutes with the patient via telehealth technology discussing the above problems/concerns.    Hannah Climes, PA-C

## 2021-03-28 ENCOUNTER — Ambulatory Visit: Payer: Medicaid Other | Admitting: Family

## 2021-03-30 ENCOUNTER — Other Ambulatory Visit: Payer: Self-pay | Admitting: Obstetrics & Gynecology

## 2021-03-30 DIAGNOSIS — F419 Anxiety disorder, unspecified: Secondary | ICD-10-CM

## 2021-03-30 DIAGNOSIS — F53 Postpartum depression: Secondary | ICD-10-CM

## 2021-06-20 ENCOUNTER — Ambulatory Visit: Payer: Medicaid Other | Admitting: Emergency Medicine

## 2021-07-25 ENCOUNTER — Ambulatory Visit: Payer: Medicaid Other | Admitting: Emergency Medicine

## 2021-08-29 ENCOUNTER — Ambulatory Visit: Payer: Medicaid Other | Admitting: Emergency Medicine

## 2021-09-17 ENCOUNTER — Ambulatory Visit: Payer: Self-pay | Admitting: Nurse Practitioner

## 2021-09-17 ENCOUNTER — Ambulatory Visit: Payer: Medicaid Other

## 2021-10-18 IMAGING — US US MFM OB COMP +14 WKS
1 series · 13 of 28 positions shown · non-contrast
Comparison: none

[Series 1: us mfm ob comp +14 wks · 13 of 131 slices shown]
[im 5/131]
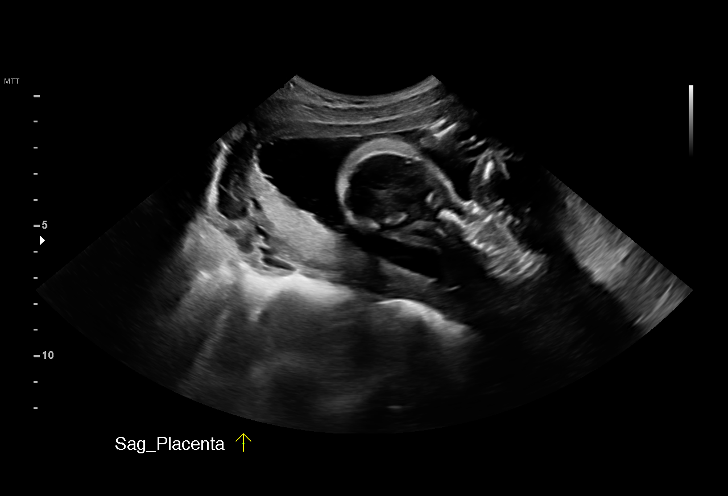
[im 15/131]
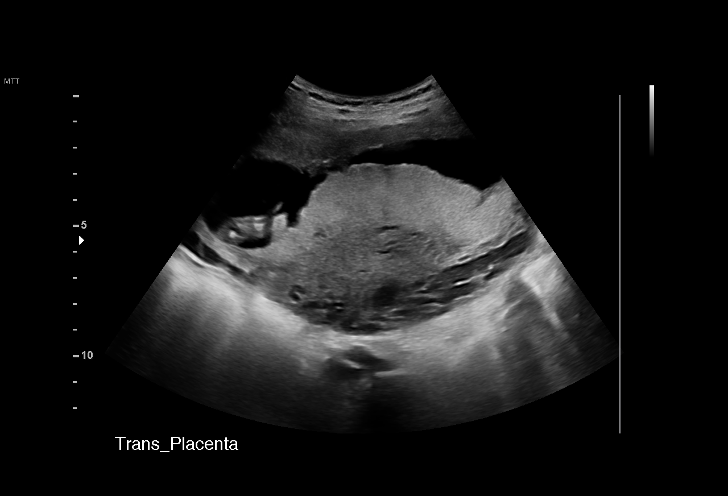
[im 25/131]
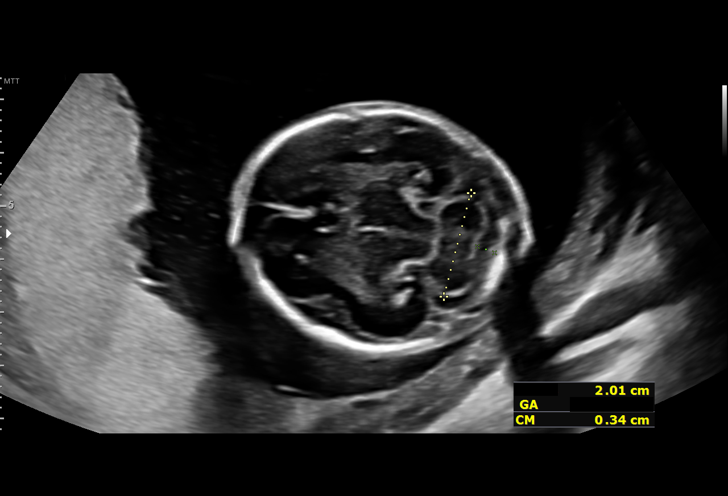
[im 34/131]
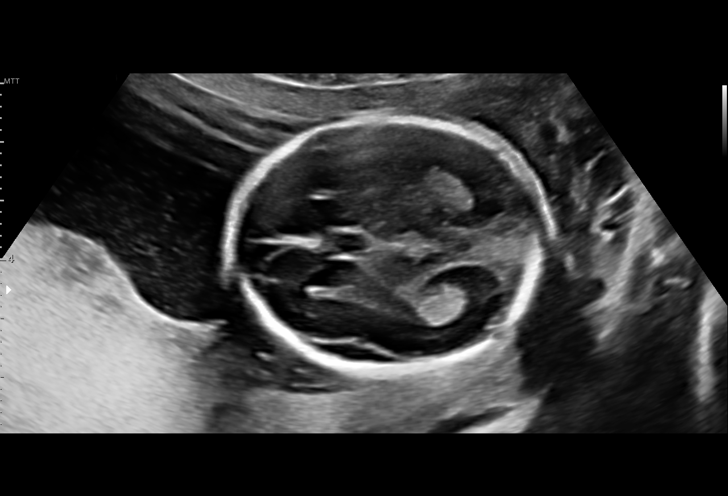
[im 44/131]
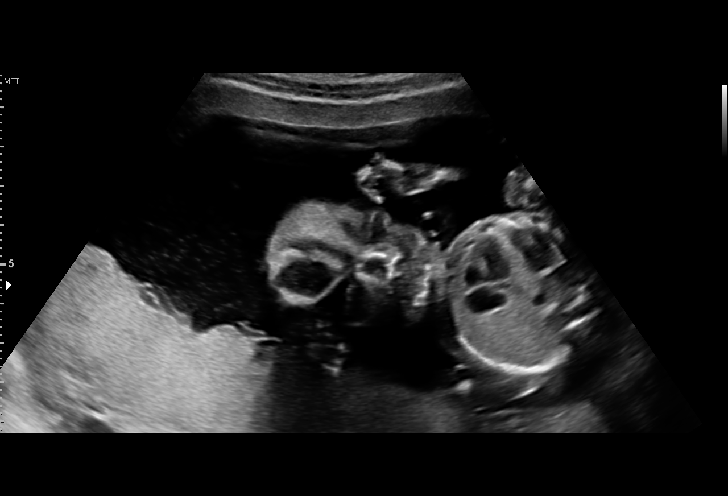
[im 53/131]
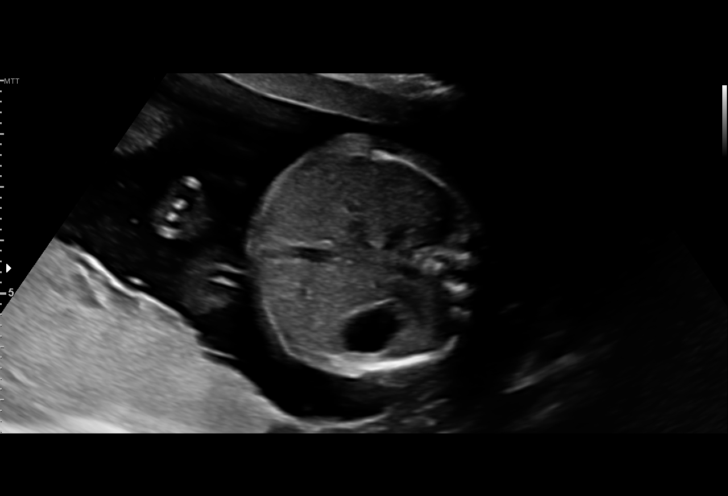
[im 68/131]
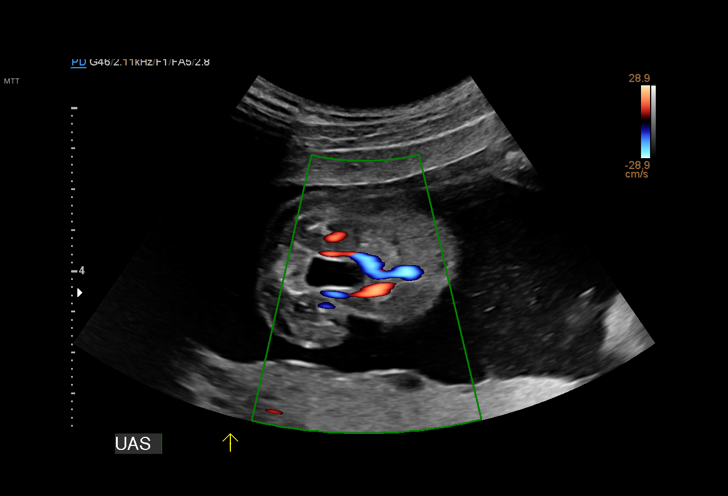
[im 78/131]
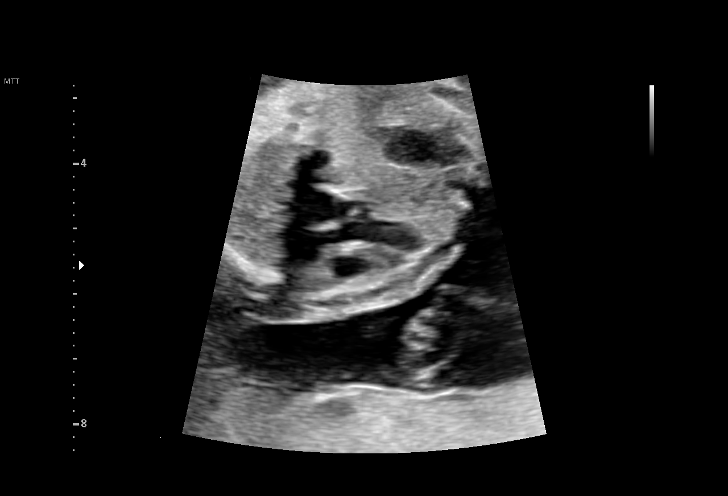
[im 87/131]
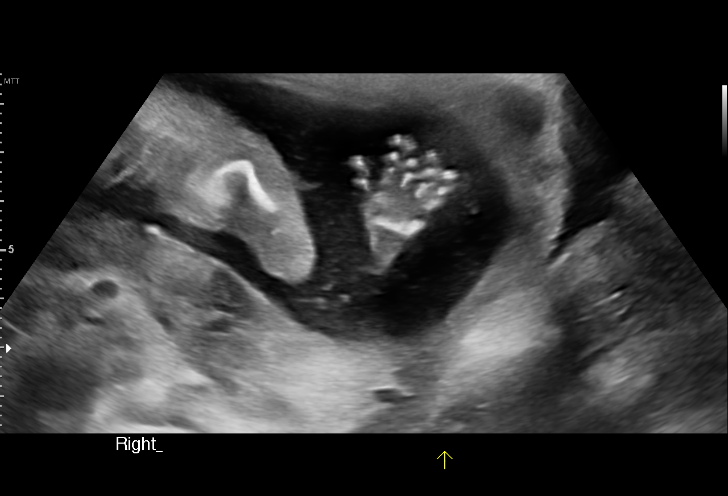
[im 97/131]
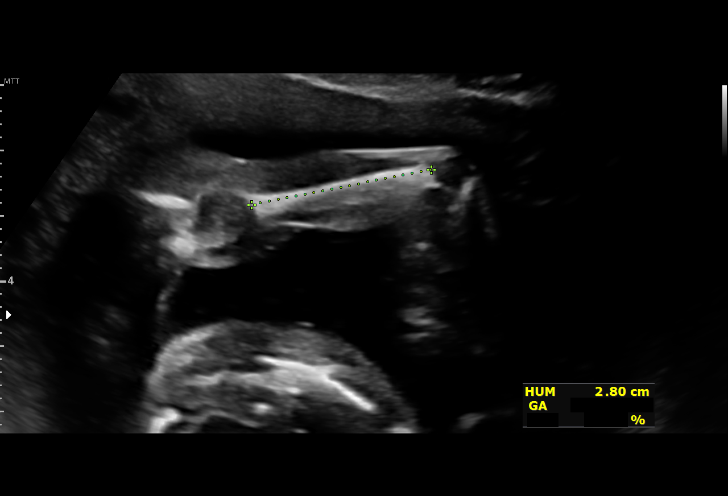
[im 106/131]
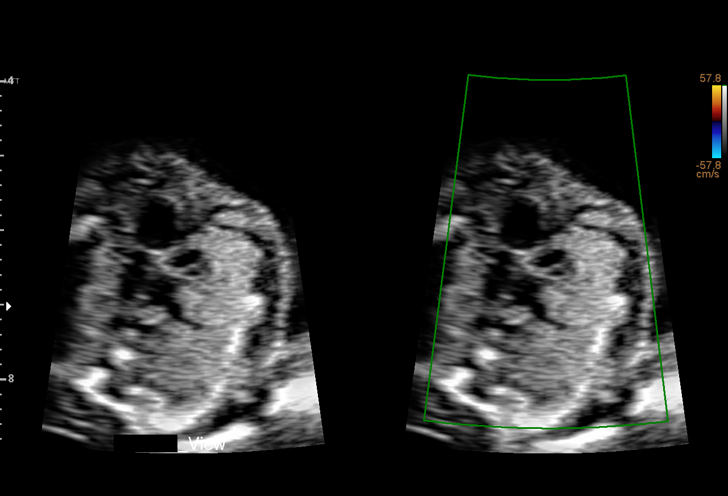
[im 116/131]
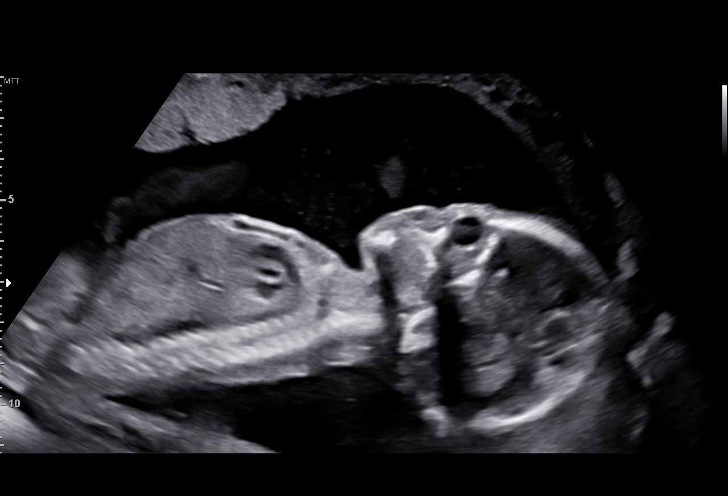
[im 126/131]
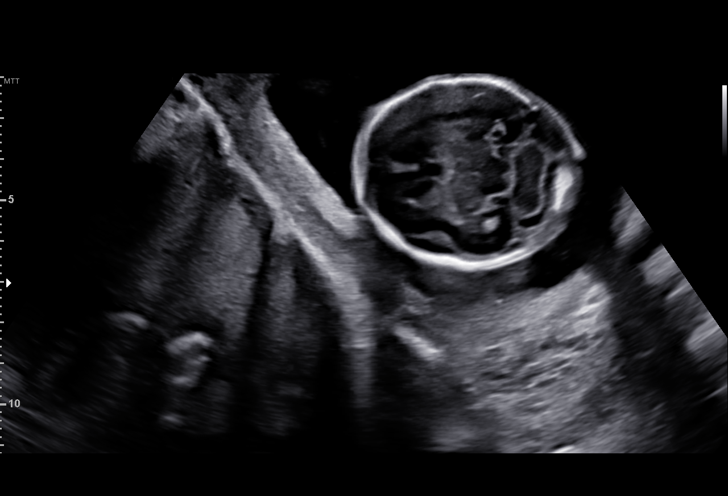

[13 of 28 positions shown; findings below may reference images not displayed]

[facility] NP                              [HOSPITAL] at
 Ref. Address:     [facility]

 1  US MFM OB COMP + 14 WK                [address]    [place]

Indications

 Encounter for antenatal screening for
 malformations
 19 weeks gestation of pregnancy
 LR NIPS/ Negative Horizon
Fetal Evaluation

 Num Of Fetuses:         1
 Fetal Heart Rate(bpm):  144
 Cardiac Activity:       Observed
 Presentation:           Variable
 Placenta:               Posterior
 P. Cord Insertion:      Visualized, central

 Amniotic Fluid
 AFI FV:      Within normal limits

                             Largest Pocket(cm)

Biometry

 BPD:      43.4  mm     G. Age:  19w 1d         50  %    CI:        76.51   %    70 - 86
                                                         FL/HC:      18.1   %    16.1 -
 HC:      157.2  mm     G. Age:  18w 4d         19  %    HC/AC:      1.12        1.09 -
 AC:      140.5  mm     G. Age:  19w 3d         56  %    FL/BPD:     65.4   %
 FL:       28.4  mm     G. Age:  18w 5d         28  %    FL/AC:      20.2   %    20 - 24
 HUM:        28  mm     G. Age:  19w 0d         47  %
 CER:      20.1  mm     G. Age:  19w 3d         53  %
 NFT:       4.9  mm

 LV:        5.4  mm
 CM:        3.4  mm

 Est. FW:     271  gm    0 lb 10 oz      40  %
OB History

 Blood Type:   O+
 Gravidity:    4         Term:   2        Prem:   0        SAB:   0
 TOP:          1
Gestational Age

 LMP:           19w 1d        Date:  01/30/20                 EDD:   11/05/20
 U/S Today:     19w 0d                                        EDD:   11/06/20
 Best:          19w 1d     Det. By:  LMP  (01/30/20)          EDD:   11/05/20
Anatomy

 Cranium:               Appears normal         Aortic Arch:            Appears normal
 Cavum:                 Appears normal         Ductal Arch:            Appears normal
 Ventricles:            Appears normal         Diaphragm:              Appears normal
 Choroid Plexus:        Appears normal         Stomach:                Appears normal, left
                                                                       sided
 Cerebellum:            Appears normal         Abdomen:                Appears normal
 Posterior Fossa:       Appears normal         Abdominal Wall:         Appears nml (cord
                                                                       insert, abd wall)
 Nuchal Fold:           Appears normal         Cord Vessels:           Appears normal (3
                                                                       vessel cord)
 Face:                  Appears normal         Kidneys:                Appear normal
                        (orbits and profile)
 Lips:                  Appears normal         Bladder:                Appears normal
 Thoracic:              Appears normal         Spine:                  Appears normal
 Heart:                 Appears normal         Upper Extremities:      Appears normal
                        (4CH, axis, and
                        situs)
 RVOT:                  Appears normal         Lower Extremities:      Appears normal
 LVOT:                  Appears normal

 Other:  Fetus appears to be female. Heels/feet and open hands/5th digits
         visualized. Nasal bone visualized. Technically difficult due to fetal
         position.
Cervix Uterus Adnexa

 Cervix
 Length:            3.3  cm.
 Normal appearance by transabdominal scan.

 Uterus
 No abnormality visualized.

 Right Ovary
 Within normal limits.

 Left Ovary
 Within normal limits.
 Cul De Sac
 No free fluid seen.

 Adnexa
 No abnormality visualized.
Comments

 This patient was seen for a detailed fetal anatomy scan.
 She denies any significant past medical history and denies
 any problems in her current pregnancy.
 She had a cell free DNA test earlier in her pregnancy which
 indicated a low risk for trisomy 21, 18, and 13. A female fetus
 is predicted.
 She was informed that the fetal growth and amniotic fluid
 level were appropriate for her gestational age.
 There were no obvious fetal anomalies noted on today's
 ultrasound exam.
 The patient was informed that anomalies may be missed due
 to technical limitations. If the fetus is in a suboptimal position
 or maternal habitus is increased, visualization of the fetus in
 the maternal uterus may be impaired.
 Follow up as indicated.

## 2022-01-04 IMAGING — DX DG CHEST 1V PORT
1 series · 2 of 2 positions shown · non-contrast
Comparison: None.

CLINICAL DATA: 24-year-old female with shortness of breath.

EXAM:
PORTABLE CHEST 1 VIEW

[Series 1: chest · 0.14mm/px · 2 of 2 slices shown]
[im 1/2]
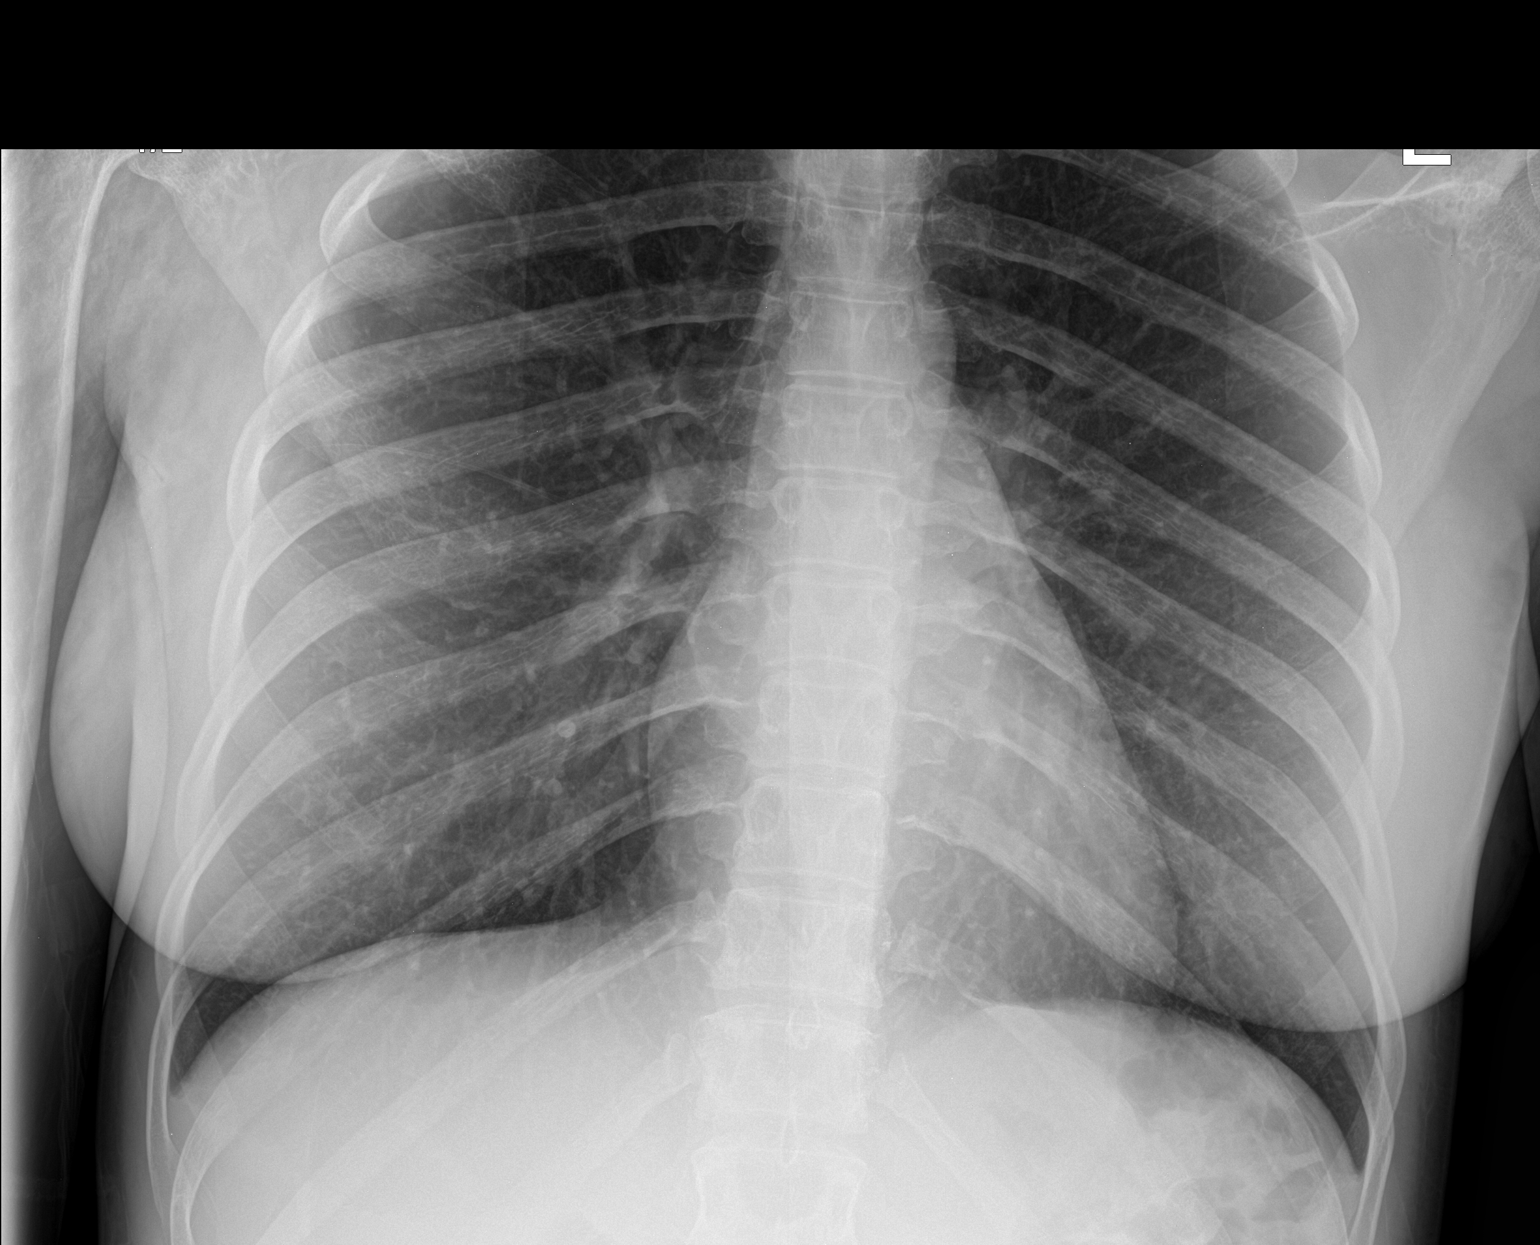
[im 2/2]
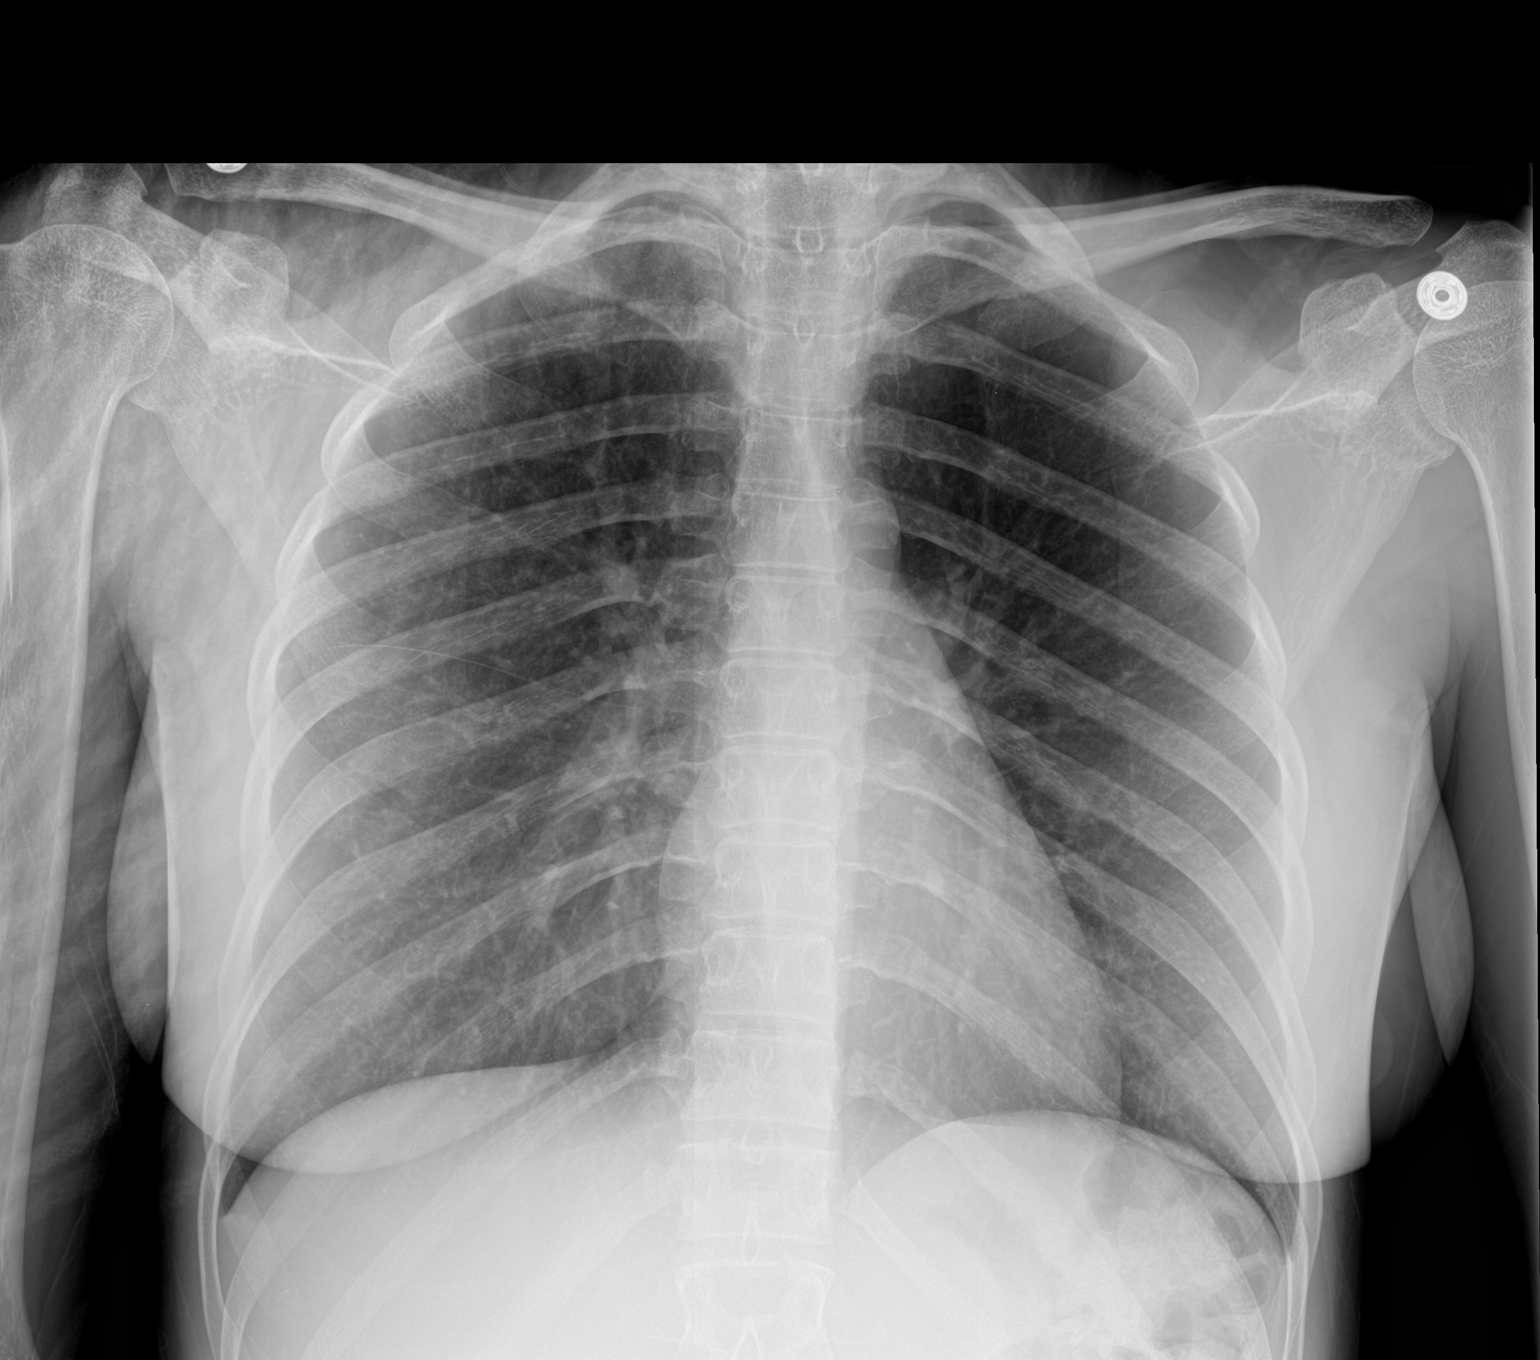

[2 of 2 positions shown; findings below may reference images not displayed]

FINDINGS: The heart size and mediastinal contours are within normal limits.
Both lungs are clear. The visualized skeletal structures are
unremarkable.
IMPRESSION: No active disease.

## 2022-01-04 IMAGING — CT CT ANGIO CHEST
2 of 6 series · 19 of 36 positions shown · IV contrast (omnipaque)
Comparison: None.

CLINICAL DATA: Shortness of breath

EXAM:
CT ANGIOGRAPHY CHEST WITH CONTRAST
TECHNIQUE: Multidetector CT imaging of the chest was performed using the
standard protocol during bolus administration of intravenous
contrast. Multiplanar CT image reconstructions and MIPs were
obtained to evaluate the vascular anatomy.
CONTRAST:  50mL OMNIPAQUE IOHEXOL 350 MG/ML SOLN

[Series 7: pe thins · axial · 0.61mm/px · z∈[+1144,+1382]mm · 18 of 377 slices shown]
[im 19/377  lung]
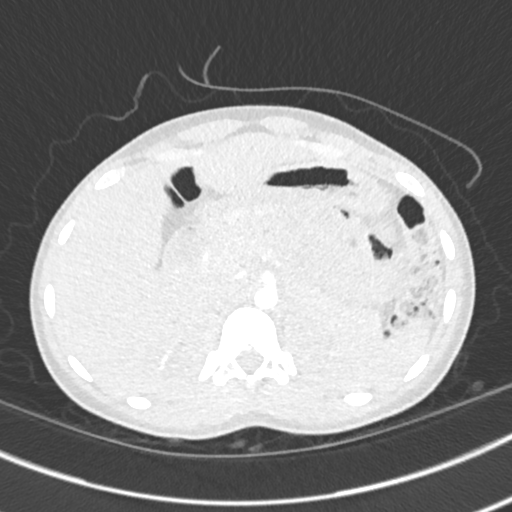
[im 38/377  mediastinal]
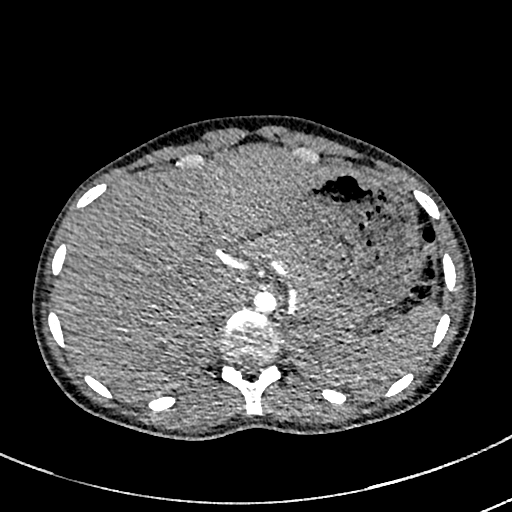
[im 57/377  lung]
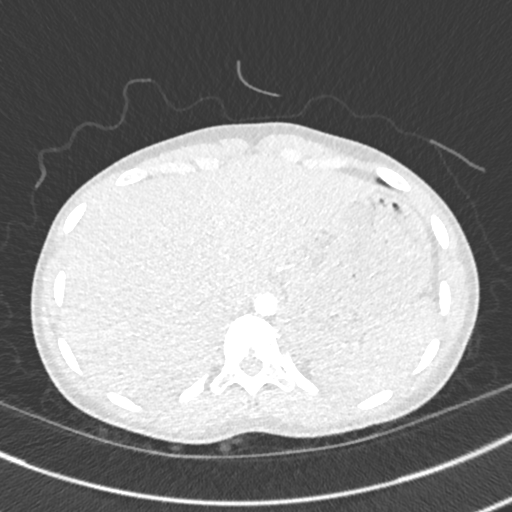
[im 76/377  mediastinal]
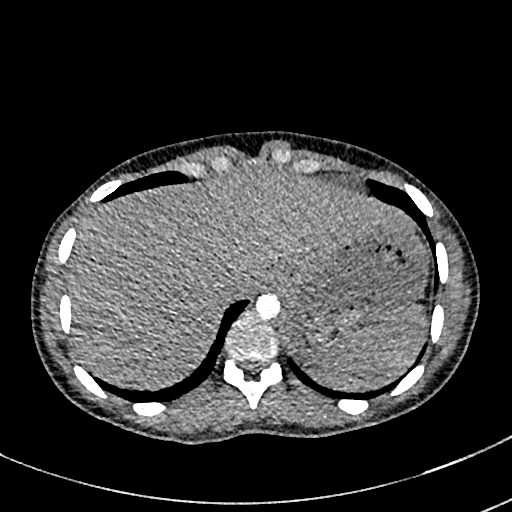
[im 95/377  lung]
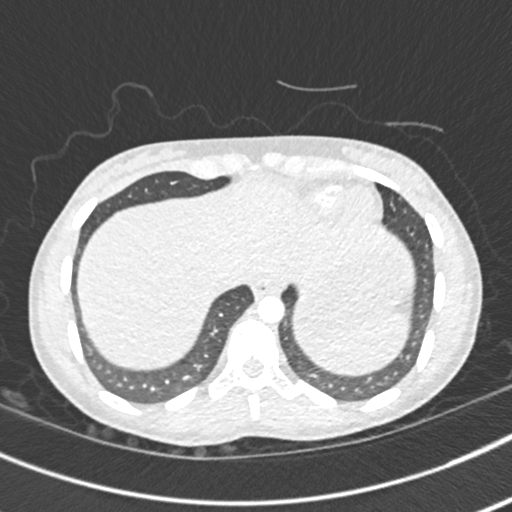
[im 113/377  mediastinal]
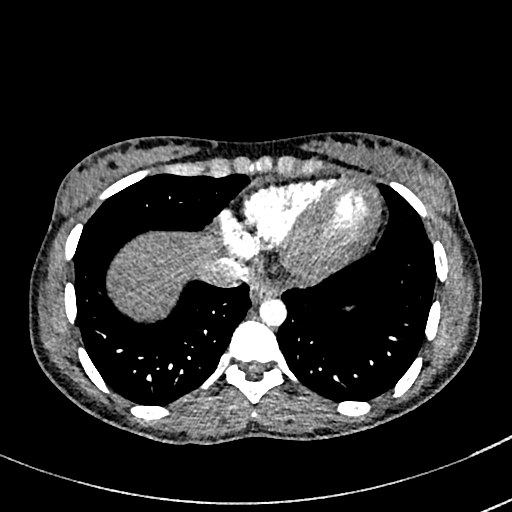
[im 132/377  lung]
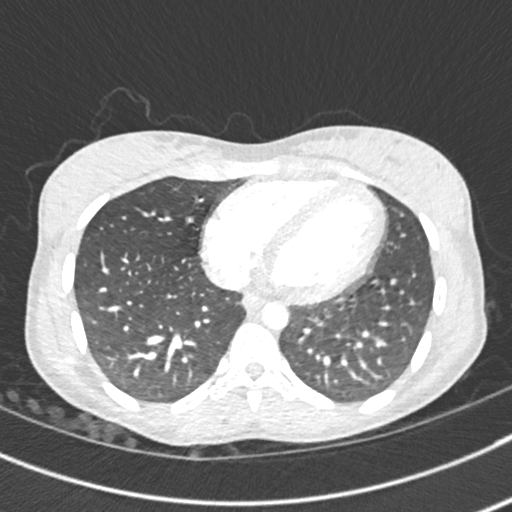
[im 151/377  mediastinal]
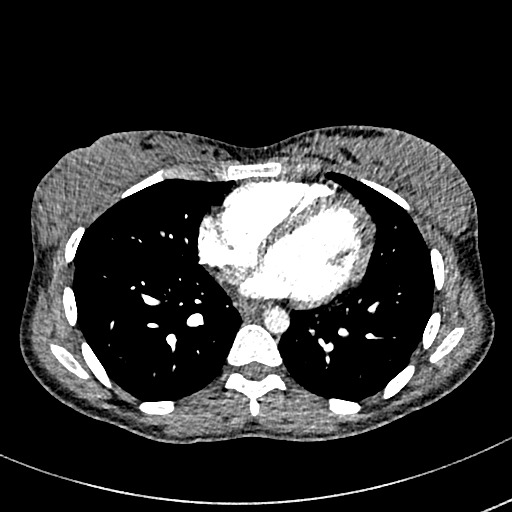
[im 170/377  lung]
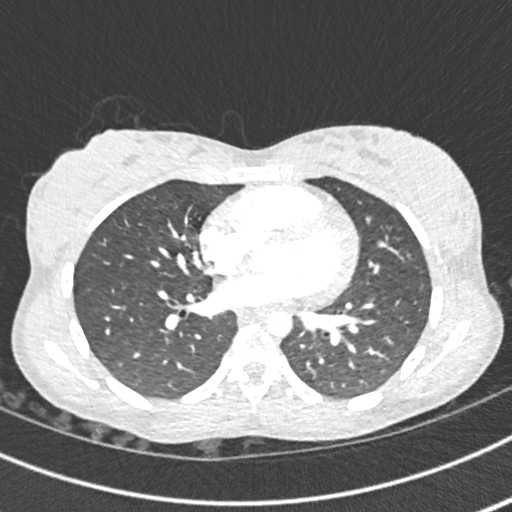
[im 207/377  mediastinal]
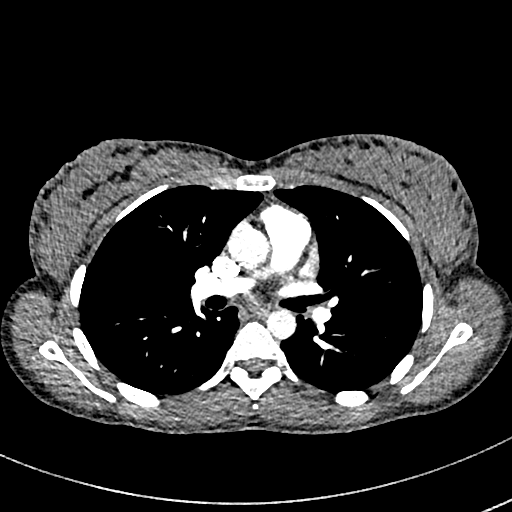
[im 226/377  lung]
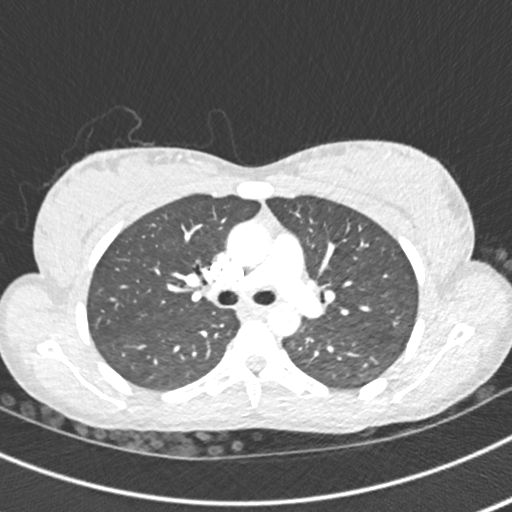
[im 245/377  mediastinal]
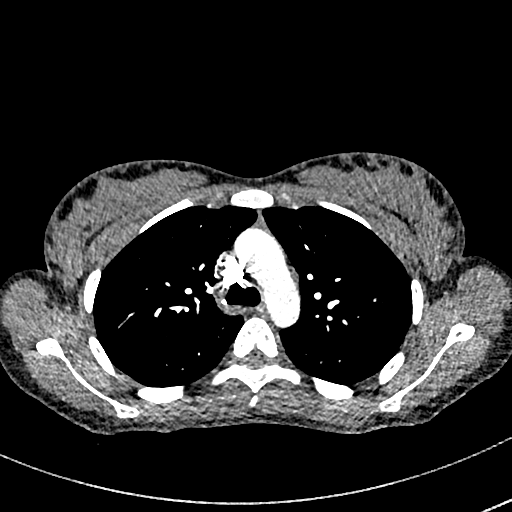
[im 264/377  lung]
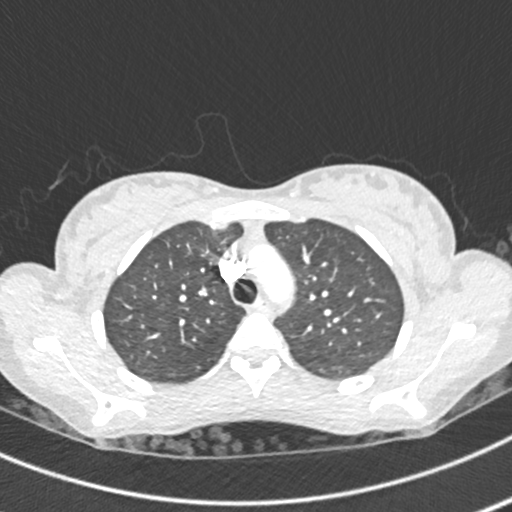
[im 283/377  mediastinal]
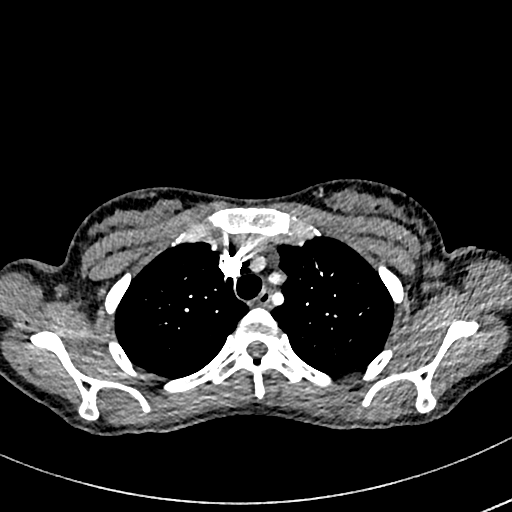
[im 301/377  lung]
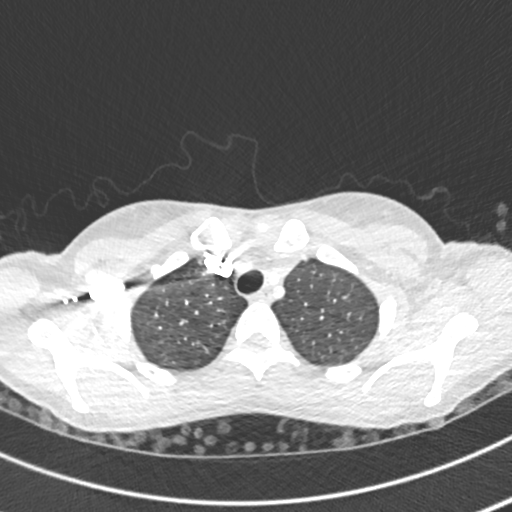
[im 320/377  mediastinal]
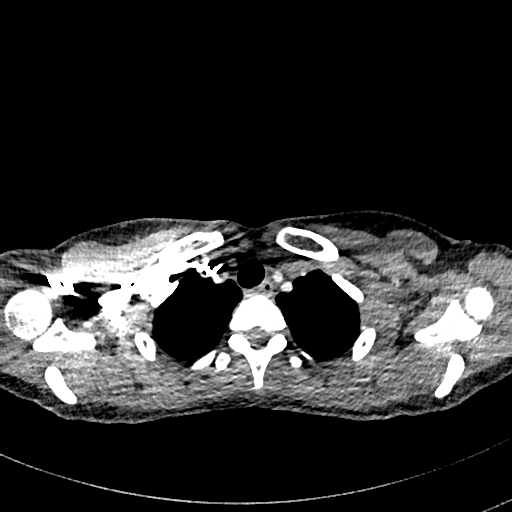
[im 339/377  lung]
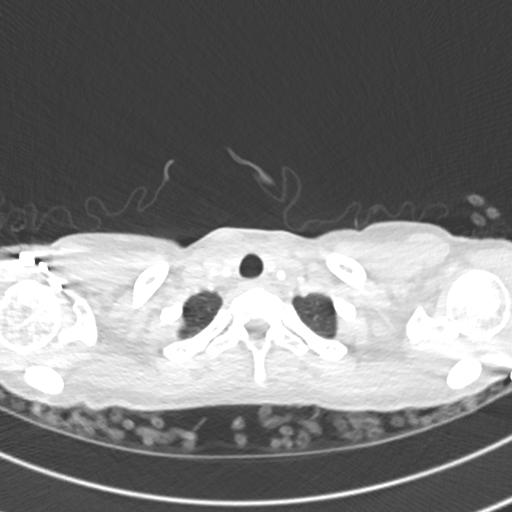
[im 358/377  mediastinal]
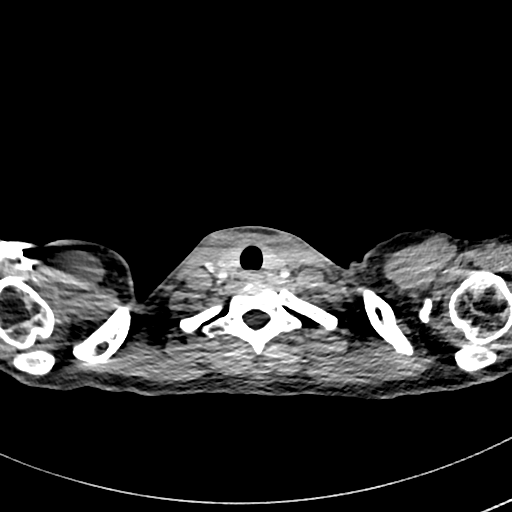

[Series 8: pe 2mm cor · coronal · 0.59mm/px · 1 of 142 slices shown]
[im 71/142  mediastinal]
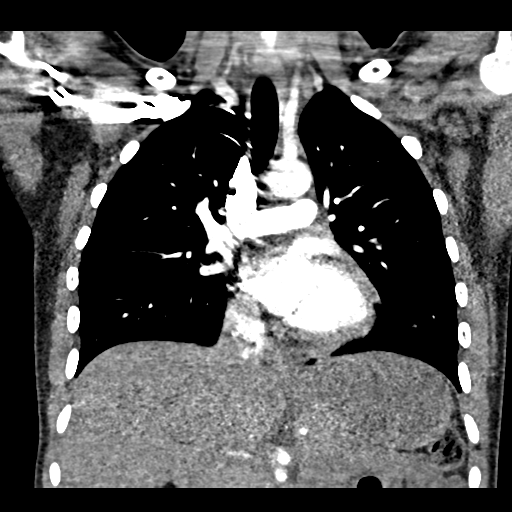

[19 of 36 positions shown; findings below may reference images not displayed]

FINDINGS: Cardiovascular: No filling defects in the pulmonary arteries to
suggest pulmonary emboli. Heart is normal size. Aorta is normal
caliber.

Mediastinum/Nodes: No mediastinal, hilar, or axillary adenopathy.
Trachea and esophagus are unremarkable. Thyroid unremarkable.

Lungs/Pleura: Lungs are clear. No focal airspace opacities or
suspicious nodules. No effusions.

Upper Abdomen: Imaging into the upper abdomen demonstrates no acute
findings.

Musculoskeletal: Chest wall soft tissues are unremarkable. No acute
bony abnormality.

Review of the MIP images confirms the above findings.
IMPRESSION: No evidence of pulmonary embolus.

No acute cardiopulmonary disease.

## 2023-09-09 ENCOUNTER — Ambulatory Visit
